# Patient Record
Sex: Female | Born: 1965
Health system: Southern US, Community
[De-identification: ages and names within clinical notes are randomized; demographics above are authoritative.]

## PROBLEM LIST (undated history)

## (undated) DIAGNOSIS — Z87898 Personal history of other specified conditions: Secondary | ICD-10-CM

## (undated) DIAGNOSIS — T7840XA Allergy, unspecified, initial encounter: Secondary | ICD-10-CM

## (undated) DIAGNOSIS — G510 Bell's palsy: Secondary | ICD-10-CM

## (undated) DIAGNOSIS — D219 Benign neoplasm of connective and other soft tissue, unspecified: Secondary | ICD-10-CM

## (undated) HISTORY — DX: Bell's palsy: G51.0

## (undated) HISTORY — DX: Personal history of other specified conditions: Z87.898

## (undated) HISTORY — PX: MYOMECTOMY: SHX85

## (undated) HISTORY — PX: HERNIA REPAIR: SHX51

## (undated) HISTORY — PX: WISDOM TOOTH EXTRACTION: SHX21

## (undated) HISTORY — DX: Benign neoplasm of connective and other soft tissue, unspecified: D21.9

## (undated) HISTORY — PX: KNEE SURGERY: SHX244

## (undated) HISTORY — DX: Allergy, unspecified, initial encounter: T78.40XA

---

## 1997-08-24 ENCOUNTER — Ambulatory Visit (HOSPITAL_COMMUNITY): Admission: RE | Admit: 1997-08-24 | Discharge: 1997-08-24 | Payer: Self-pay | Admitting: Family Medicine

## 1997-08-24 ENCOUNTER — Other Ambulatory Visit: Admission: RE | Admit: 1997-08-24 | Discharge: 1997-08-24 | Payer: Self-pay | Admitting: Family Medicine

## 1999-12-08 ENCOUNTER — Encounter: Admission: RE | Admit: 1999-12-08 | Discharge: 2000-01-26 | Payer: Self-pay | Admitting: Orthopedic Surgery

## 2000-03-01 ENCOUNTER — Encounter: Payer: Self-pay | Admitting: Family Medicine

## 2000-03-01 ENCOUNTER — Ambulatory Visit (HOSPITAL_COMMUNITY): Admission: RE | Admit: 2000-03-01 | Discharge: 2000-03-01 | Payer: Self-pay | Admitting: Family Medicine

## 2001-11-12 ENCOUNTER — Emergency Department (HOSPITAL_COMMUNITY): Admission: EM | Admit: 2001-11-12 | Discharge: 2001-11-12 | Payer: Self-pay | Admitting: Emergency Medicine

## 2001-11-12 ENCOUNTER — Encounter: Payer: Self-pay | Admitting: Emergency Medicine

## 2002-09-01 ENCOUNTER — Encounter: Payer: Self-pay | Admitting: Family Medicine

## 2002-09-01 ENCOUNTER — Ambulatory Visit (HOSPITAL_COMMUNITY): Admission: RE | Admit: 2002-09-01 | Discharge: 2002-09-01 | Payer: Self-pay | Admitting: Family Medicine

## 2004-03-31 ENCOUNTER — Ambulatory Visit: Payer: Self-pay | Admitting: Family Medicine

## 2004-03-31 ENCOUNTER — Ambulatory Visit: Payer: Self-pay | Admitting: Internal Medicine

## 2004-04-14 ENCOUNTER — Ambulatory Visit (HOSPITAL_COMMUNITY): Admission: RE | Admit: 2004-04-14 | Discharge: 2004-04-14 | Payer: Self-pay | Admitting: Family Medicine

## 2004-04-19 ENCOUNTER — Ambulatory Visit: Payer: Self-pay | Admitting: Family Medicine

## 2006-09-25 ENCOUNTER — Ambulatory Visit: Payer: Self-pay | Admitting: Family Medicine

## 2006-10-02 ENCOUNTER — Ambulatory Visit (HOSPITAL_COMMUNITY): Admission: RE | Admit: 2006-10-02 | Discharge: 2006-10-02 | Payer: Self-pay | Admitting: Internal Medicine

## 2006-10-09 ENCOUNTER — Encounter (INDEPENDENT_AMBULATORY_CARE_PROVIDER_SITE_OTHER): Payer: Self-pay | Admitting: Family Medicine

## 2006-10-09 ENCOUNTER — Ambulatory Visit: Payer: Self-pay | Admitting: Family Medicine

## 2006-11-25 ENCOUNTER — Ambulatory Visit: Payer: Self-pay | Admitting: Family Medicine

## 2006-11-25 ENCOUNTER — Telehealth (INDEPENDENT_AMBULATORY_CARE_PROVIDER_SITE_OTHER): Payer: Self-pay | Admitting: Family Medicine

## 2006-11-25 ENCOUNTER — Encounter: Payer: Self-pay | Admitting: Family Medicine

## 2006-11-25 ENCOUNTER — Ambulatory Visit: Payer: Self-pay | Admitting: *Deleted

## 2006-11-27 ENCOUNTER — Telehealth (INDEPENDENT_AMBULATORY_CARE_PROVIDER_SITE_OTHER): Payer: Self-pay | Admitting: *Deleted

## 2006-11-29 DIAGNOSIS — D259 Leiomyoma of uterus, unspecified: Secondary | ICD-10-CM | POA: Insufficient documentation

## 2006-12-18 ENCOUNTER — Encounter (INDEPENDENT_AMBULATORY_CARE_PROVIDER_SITE_OTHER): Payer: Self-pay | Admitting: *Deleted

## 2007-11-19 ENCOUNTER — Ambulatory Visit (HOSPITAL_COMMUNITY): Admission: RE | Admit: 2007-11-19 | Discharge: 2007-11-19 | Payer: Self-pay | Admitting: Family Medicine

## 2007-12-03 ENCOUNTER — Ambulatory Visit (HOSPITAL_COMMUNITY): Admission: EM | Admit: 2007-12-03 | Discharge: 2007-12-04 | Payer: Self-pay | Admitting: Emergency Medicine

## 2007-12-03 ENCOUNTER — Encounter (INDEPENDENT_AMBULATORY_CARE_PROVIDER_SITE_OTHER): Payer: Self-pay | Admitting: General Surgery

## 2008-05-20 ENCOUNTER — Telehealth (INDEPENDENT_AMBULATORY_CARE_PROVIDER_SITE_OTHER): Payer: Self-pay | Admitting: *Deleted

## 2008-05-25 ENCOUNTER — Ambulatory Visit: Payer: Self-pay | Admitting: Internal Medicine

## 2008-05-25 DIAGNOSIS — M25569 Pain in unspecified knee: Secondary | ICD-10-CM

## 2008-06-08 ENCOUNTER — Ambulatory Visit: Payer: Self-pay | Admitting: Internal Medicine

## 2008-10-11 ENCOUNTER — Telehealth (INDEPENDENT_AMBULATORY_CARE_PROVIDER_SITE_OTHER): Payer: Self-pay | Admitting: Nurse Practitioner

## 2008-10-15 ENCOUNTER — Ambulatory Visit (HOSPITAL_COMMUNITY): Admission: RE | Admit: 2008-10-15 | Discharge: 2008-10-15 | Payer: Self-pay | Admitting: Physician Assistant

## 2008-10-15 ENCOUNTER — Telehealth: Payer: Self-pay | Admitting: Physician Assistant

## 2008-10-15 ENCOUNTER — Ambulatory Visit: Payer: Self-pay | Admitting: Physician Assistant

## 2008-10-15 DIAGNOSIS — S6710XA Crushing injury of unspecified finger(s), initial encounter: Secondary | ICD-10-CM | POA: Insufficient documentation

## 2010-08-15 NOTE — H&P (Signed)
NAMEKENIA, Laurie Hull              ACCOUNT NO.:  [id number]   MEDICAL RECORD NO.:  [id number]          PATIENT TYPE:  INP   LOCATION:  5125                         FACILITY:  [facility]   PHYSICIAN:  Gabrielle Dare. Janee Morn, M.D.DATE OF BIRTH:  1965/07/25   DATE OF ADMISSION:  12/02/2007  DATE OF DISCHARGE:                              HISTORY & PHYSICAL   CHIEF COMPLAINT:  Umbilical pain.   HISTORY OF PRESENT ILLNESS:  Laurie Hull is a 45 year old African  American female who developed periumbilical pain and a small lump  starting Sunday on November 30, 2007, after sexual intercourse.  The area  was okay yesterday; however, she had increasing pain in the umbilical  region with an increased size in the lump throughout the day today.  She  had decreased appetite.  She came to the emergency department for  further evaluation.  She was seen by the emergency department physician  and physician assistant.  The CT scan of the abdomen and pelvis was  obtained, demonstrating an umbilical hernia containing omentum.  No  bowel is involved in the hernia.  However, there is some question of  necrosis of the omentum.  She continues to have some significant pain in  the area.  She has had no nausea or vomiting and has no other  complaints.  She has had one bowel movement since Sunday.   PAST MEDICAL HISTORY:  Fibroid uterus.   PAST SURGICAL HISTORY:  None.   SOCIAL HISTORY:  She smokes cigarettes.  She smokes marijuana.  She  drinks alcohol in weekends, and she does occasionally use cocaine.  She  was laid off from [employer] at [place], but hopes to return.   ALLERGIES:  No known drug allergies.   MEDICATIONS:  None.   PAST SURGICAL HISTORY:  None.   REVIEW OF SYSTEMS:  ABDOMEN:  Abdominal complaints as described above.  PULMONARY:  Negative.  GU:  Negative with no current fibroid pain.  CARDIOVASCULAR:  Negative.  CONSTITUTIONAL:  Negative.  NEUROPSYCH:  Remainder of the review of systems is  negative.   PHYSICAL EXAMINATION:  VITALS:  Temperature 97.3, pulse 59, respirations  18, blood pressure 124/75.  GENERAL:  She is awake and alert.  She appears somewhat elder than  stated age.  She is in no distress.  HEENT:  Pupils were equal round and reactive.  Sclerae is clear.  Oral  mucosa is moist.  NECK:  Supple with no tenderness.  LYMPH:  Reveals no supraclavicular, cervical, or inguinal  lymphadenopathy.  The periumbilical region is difficult to assess  secondary to her hernia.  PULMONARY:  Lungs are clear to auscultation with no wheezing.  Respiratory effort is good.  CARDIAC:  Regular.  No murmurs are heard.  Impulse is palpable in the  left chest.  ABDOMEN:  Soft.  There is no generalized tenderness.  Bowel sounds are  hypoactive.  She has 3 cm umbilical hernia just beneath her umbilicus.  It is incarcerated and unable to be reduced.  It is very painful.  There  is also some minimal overlying erythema of her skin.  Enlarged uterus is  palpable in the lower midline.  EXTREMITIES:  With no deformities or tenderness.  SKIN:  Warm and dry with exceptional findings.  NEUROLOGIC:  She is alert and oriented, and moves all extremities well  without focal deficits noted.   LABORATORY DATA:  Laboratory studies are pending.  CT scan is as  described above demonstrating the umbilical hernia; however, it also  shows a 16 cm fibroid uterus.   IMPRESSION:  Incarcerated umbilical hernia.   PLAN:  We will take her to the operating room emergently tonight for  repair possibly with mesh.  Procedure, risks, and benefits were  discussed in detail with the patient and her father.  Questions were  answered.  She is agreeable.  We will give her IV antibiotics and call  the [facility] institution.      Gabrielle Dare Janee Morn, M.D.  Electronically Signed     BET/MEDQ  D:  12/02/2007  T:  12/03/2007  Job:  [id number]

## 2010-08-15 NOTE — Op Note (Signed)
Laurie Hull, Laurie Hull              ACCOUNT NO.:  [id number]   MEDICAL RECORD NO.:  [id number]          PATIENT TYPE:  OIB   LOCATION:  5125                         FACILITY:  [facility]   PHYSICIAN:  Gabrielle Dare. Janee Morn, M.D.DATE OF BIRTH:  [birth year]   DATE OF PROCEDURE:  12/03/2007  DATE OF DISCHARGE:                               OPERATIVE REPORT   PREOPERATIVE DIAGNOSIS:  Incarcerated umbilical hernia.   POSTOPERATIVE DIAGNOSIS:  Incarcerated umbilical hernia.   PROCEDURE:  1. Repair of incarcerated umbilical hernia with mesh.  2. Partial omentectomy.   SURGEON:  Gabrielle Dare. Janee Morn, M.D.   ANESTHESIA:  General.   HISTORY OF PRESENT ILLNESS:  [name] is a 45 year old African  American female who developed sudden onset of periumbilical pain and a  small lump on November 30, 2007.  The pain increased significantly and the  lump enlarged.  She was evaluated in the emergency department.  Physical  exam and CT scan of the abdomen and pelvis were consistent with  incarcerated umbilical hernia containing omentum with possible omental  necrosis.  She was evaluated and brought emergently to the operating  room for repair of this incarcerated umbilical hernia.   PROCEDURE IN DETAIL:  An informed consent was obtained.  The patient was  identified in the preop holding area.  She received intravenous  antibiotics.  She was brought to the operating room and general  endotracheal anesthesia was administered by the anesthesia staff.  Her  abdomen was prepped and draped in the sterile fashion.  A infraumbilical  region was infiltrated with 0.25% Marcaine with epinephrine.  A  curvilinear infraumbilical incision was made.  Subcutaneous tissues were  dissected down revealing the hernia sac containing the omentum.  This  was circumferentially dissected and the umbilical skin was carefully  dissected off the hernia sac, keeping it intact.  Sac was then entered,  it contained omentum with some patchy  areas of contusions; however, no  frank necrosis was seen.  The hernia sac was excised.  The omentum was  about 4-5 cm in size, which was protruding through 1.5 cm defect.  This  was circumferentially dissected, and the omentum was divided with Kelly  clamps and tied securely with 2-0 Vicryl suture, achieving excellent  hemostasis.  The omentum and hernia sac were sent to pathology.  Hemostasis was ensured.  The fascia was circumferentially cleared away  in preparation for the hernia repair.  The hernia repair was then  accomplished with a polypropylene mesh was cut to size to provide at  least 1.5 circumferential inlay beyond the defect.  This was then  sutured in an inlay fashion with 0-Prolene sutures circumferentially.  The mesh laid nicely behind the fascia.  Some additional 0 Prolene  sutures were then used to close the fascia over the mesh.  The area was  copiously irrigated and hemostasis was ensured.  The umbilicus was  intact, taken down to the underlying fascia was interrupted with 2-0  Vicryl sutures.  Subcutaneous tissue was closed with running primarily  with interrupted 3-0 Vicryl sutures and the skin was closing with  running  4-0 Monocryl subcuticular stitch.  The  sponge, needle, and instrument counts were all correct.  Benzoin, Steri-  Strips, a sterile cotton-ball and sterile dressing were applied.  The  patient tolerated the procedure well without apparent complications, was  taken to the recovery room in stable condition.      Gabrielle Dare Janee Morn, M.D.  Electronically Signed     BET/MEDQ  D:  12/03/2007  T:  12/03/2007  Job:  [id number]

## 2010-09-01 ENCOUNTER — Other Ambulatory Visit: Payer: Self-pay | Admitting: Obstetrics and Gynecology

## 2010-09-01 ENCOUNTER — Encounter (HOSPITAL_COMMUNITY): Payer: Managed Care, Other (non HMO)

## 2010-09-01 ENCOUNTER — Other Ambulatory Visit (HOSPITAL_COMMUNITY): Payer: Self-pay

## 2010-09-01 LAB — CBC
HCT: 35.2 % — ABNORMAL LOW (ref 36.0–46.0)
Hemoglobin: 11.6 g/dL — ABNORMAL LOW (ref 12.0–15.0)
MCH: 30.3 pg (ref 26.0–34.0)
MCV: 91.9 fL (ref 78.0–100.0)
Platelets: 254 10*3/uL (ref 150–400)
RBC: 3.83 MIL/uL — ABNORMAL LOW (ref 3.87–5.11)
WBC: 4.3 10*3/uL (ref 4.0–10.5)

## 2010-09-05 ENCOUNTER — Inpatient Hospital Stay (HOSPITAL_COMMUNITY)
Admission: RE | Admit: 2010-09-05 | Discharge: 2010-09-08 | DRG: 743 | Disposition: A | Discharge status: Home / Self Care | Payer: Managed Care, Other (non HMO) | Source: Ambulatory Visit | Attending: Obstetrics and Gynecology | Admitting: Obstetrics and Gynecology

## 2010-09-05 ENCOUNTER — Other Ambulatory Visit: Payer: Self-pay | Admitting: Obstetrics and Gynecology

## 2010-09-05 DIAGNOSIS — N979 Female infertility, unspecified: Secondary | ICD-10-CM | POA: Diagnosis present

## 2010-09-05 DIAGNOSIS — Z01818 Encounter for other preprocedural examination: Secondary | ICD-10-CM

## 2010-09-05 DIAGNOSIS — Z01812 Encounter for preprocedural laboratory examination: Secondary | ICD-10-CM

## 2010-09-05 DIAGNOSIS — N920 Excessive and frequent menstruation with regular cycle: Secondary | ICD-10-CM | POA: Diagnosis present

## 2010-09-05 DIAGNOSIS — D251 Intramural leiomyoma of uterus: Principal | ICD-10-CM | POA: Diagnosis present

## 2010-09-05 LAB — CBC
HCT: 24.9 % — ABNORMAL LOW (ref 36.0–46.0)
HCT: 23.4 % — ABNORMAL LOW (ref 36.0–46.0)
Hemoglobin: 7.7 g/dL — ABNORMAL LOW (ref 12.0–15.0)
Platelets: 256 10*3/uL (ref 150–400)
RBC: 2.7 MIL/uL — ABNORMAL LOW (ref 3.87–5.11)
RBC: 2.52 MIL/uL — ABNORMAL LOW (ref 3.87–5.11)
RDW: 13 % (ref 11.5–15.5)
WBC: 16.7 10*3/uL — ABNORMAL HIGH (ref 4.0–10.5)

## 2010-09-05 LAB — ABO/RH: ABO/RH(D): B POS

## 2010-09-05 LAB — PREGNANCY, URINE: Preg Test, Ur: NEGATIVE

## 2010-09-06 LAB — CBC
HCT: 17.1 % — ABNORMAL LOW (ref 36.0–46.0)
HCT: 19.1 % — ABNORMAL LOW (ref 36.0–46.0)
HCT: 20.4 % — ABNORMAL LOW (ref 36.0–46.0)
Hemoglobin: 5.7 g/dL — CL (ref 12.0–15.0)
MCH: 30.3 pg (ref 26.0–34.0)
MCH: 30.4 pg (ref 26.0–34.0)
MCH: 30.5 pg (ref 26.0–34.0)
MCHC: 33.3 g/dL (ref 30.0–36.0)
MCHC: 34.3 g/dL (ref 30.0–36.0)
MCV: 88.7 fL (ref 78.0–100.0)
MCV: 90.5 fL (ref 78.0–100.0)
MCV: 91.4 fL (ref 78.0–100.0)
RBC: 2.11 MIL/uL — ABNORMAL LOW (ref 3.87–5.11)
RDW: 13.1 % (ref 11.5–15.5)
RDW: 14.2 % (ref 11.5–15.5)
WBC: 9.8 10*3/uL (ref 4.0–10.5)

## 2010-09-07 LAB — TYPE AND SCREEN
ABO/RH(D): B POS
Antibody Screen: NEGATIVE
Unit division: 0

## 2010-09-07 LAB — CBC
HCT: 23.4 % — ABNORMAL LOW (ref 36.0–46.0)
MCH: 29.9 pg (ref 26.0–34.0)
MCHC: 34.1 g/dL (ref 30.0–36.0)
MCHC: 33.8 g/dL (ref 30.0–36.0)
MCV: 87.6 fL (ref 78.0–100.0)
Platelets: 146 10*3/uL — ABNORMAL LOW (ref 150–400)
Platelets: 157 10*3/uL (ref 150–400)
RBC: 2.41 MIL/uL — ABNORMAL LOW (ref 3.87–5.11)
RDW: 14.7 % (ref 11.5–15.5)
RDW: 14.8 % (ref 11.5–15.5)

## 2010-09-07 LAB — PROTIME-INR: Prothrombin Time: 14.2 seconds (ref 11.6–15.2)

## 2010-09-11 ENCOUNTER — Inpatient Hospital Stay (HOSPITAL_COMMUNITY)
Admission: AD | Admit: 2010-09-11 | Discharge: 2010-09-11 | Disposition: A | Discharge status: Home / Self Care | Payer: Managed Care, Other (non HMO) | Source: Ambulatory Visit | Attending: Obstetrics and Gynecology | Admitting: Obstetrics and Gynecology

## 2010-09-11 DIAGNOSIS — R509 Fever, unspecified: Secondary | ICD-10-CM | POA: Insufficient documentation

## 2010-09-11 LAB — CBC
HCT: 24.6 % — ABNORMAL LOW (ref 36.0–46.0)
Hemoglobin: 8.3 g/dL — ABNORMAL LOW (ref 12.0–15.0)
MCH: 30.1 pg (ref 26.0–34.0)
MCHC: 33.7 g/dL (ref 30.0–36.0)

## 2010-10-25 NOTE — Op Note (Signed)
  NAMEVEVERLY, LARIMER              ACCOUNT NO.:  [id number]  MEDICAL RECORD NO.:  [id number]  LOCATION:  9309                          FACILITY:  [facility]  PHYSICIAN:  Zelphia Cairo, MD    DATE OF BIRTH:  [birth year]  DATE OF PROCEDURE:  09/05/2010 DATE OF DISCHARGE:                              OPERATIVE REPORT   PREOPERATIVE DIAGNOSES: 1. Uterine fibroids. 2. Infertility. 3. Menorrhagia.  PROCEDURE:  Myomectomy.  SURGEON:  Zelphia Cairo, MD  ASSISTANT:  Marcelle Overlie.  ANESTHESIA:  General.  FINDINGS:  Multiple uterine fibroids, approximately 36 removed and sent to Pathology.  URINE OUTPUT:  100 mL, clear.  ESTIMATED BLOOD LOSS:  700 mL.  COMPLICATIONS:  None.  CONDITION:  Stable to recovery room.  PROCEDURE:  After informed consent was obtained, the patient was taken to the operating room where she was prepped and draped in sterile fashion.  She was placed in the supine position and given general anesthesia.  She was prepped and draped in sterile fashion and a Foley catheter was in inserted sterilely.  Pfannenstiel skin incision was made with a scalpel and extended to the underlying fascia.  The fascia was incised in the midline.  This was extended laterally using curved Mayo scissors.  Kocher clamps were used to grasp the superior and inferior portion of the fascia.  The fascia was tented upwards and the underlying rectus muscles were dissected off using the Bovie.  Peritoneum was identified and tented upwards.  This was entered sharply with Metzenbaum scissors.  This was extended superiorly and inferiorly with good visualization of the bladder.  The uterus was then manually delivered through the uterine incision.  Multiple uterine fibroids were noted. She had a large pedunculated fundal fibroid along with multiple intramural fibroids.  An incision was made over the fibroid and a towel clamp was used to grasp the fibroid.  The myometrium was dissected off using the  Bovie and Metzenbaum scissors.  The uterine defect was closed using double layer closure of 0 Monocryl and 0 Vicryl pop.  This procedure was repeated multiple times until all uterine fibroids were removed.  The fallopian tubes and ovaries appeared normal.  Surgicel was placed over the uterine incisions and the uterus was placed back into the pelvis.  The uterus was reinspected and again found to be hemostatic.  The peritoneum was closed with 0 Monocryl.  The fascia was closed with a looped 0 PDS, and the skin was closed with staples. Sponge, lap, needle, and instrument counts were correct x2.  She was taken to the recovery room.  A CBC will be drawn in the recovery room as blood loss was 1700 mL.  During surgery, we called out to talk with the family and after surgery was completed, I walked out to talk with the family however, no one was in all 3 waiting rooms.     Zelphia Cairo, MD     GA/MEDQ  D:  09/05/2010  T:  09/05/2010  Job:  [id number]  Electronically Signed by Zelphia Cairo MD on 10/25/2010 05:12:39 PM

## 2011-01-03 LAB — POCT I-STAT, CHEM 8
BUN: 4 — ABNORMAL LOW
Calcium, Ion: 1.25
Chloride: 103
Creatinine, Ser: 1.1
Glucose, Bld: 86
TCO2: 27

## 2011-03-07 ENCOUNTER — Ambulatory Visit: Payer: Managed Care, Other (non HMO) | Admitting: Medical

## 2011-12-18 ENCOUNTER — Encounter (HOSPITAL_COMMUNITY): Payer: Self-pay | Admitting: Emergency Medicine

## 2011-12-18 ENCOUNTER — Emergency Department (HOSPITAL_COMMUNITY): Payer: Self-pay

## 2011-12-18 ENCOUNTER — Emergency Department (HOSPITAL_COMMUNITY)
Admission: EM | Admit: 2011-12-18 | Discharge: 2011-12-19 | Disposition: A | Discharge status: Home / Self Care | Payer: Self-pay | Attending: Emergency Medicine | Admitting: Emergency Medicine

## 2011-12-18 DIAGNOSIS — B9689 Other specified bacterial agents as the cause of diseases classified elsewhere: Secondary | ICD-10-CM | POA: Insufficient documentation

## 2011-12-18 DIAGNOSIS — Z888 Allergy status to other drugs, medicaments and biological substances status: Secondary | ICD-10-CM | POA: Insufficient documentation

## 2011-12-18 DIAGNOSIS — A499 Bacterial infection, unspecified: Secondary | ICD-10-CM | POA: Insufficient documentation

## 2011-12-18 DIAGNOSIS — N760 Acute vaginitis: Secondary | ICD-10-CM | POA: Insufficient documentation

## 2011-12-18 DIAGNOSIS — R109 Unspecified abdominal pain: Secondary | ICD-10-CM | POA: Insufficient documentation

## 2011-12-18 DIAGNOSIS — E669 Obesity, unspecified: Secondary | ICD-10-CM | POA: Insufficient documentation

## 2011-12-18 LAB — COMPREHENSIVE METABOLIC PANEL
ALT: 12 U/L (ref 0–35)
AST: 13 U/L (ref 0–37)
CO2: 27 mEq/L (ref 19–32)
Calcium: 9.7 mg/dL (ref 8.4–10.5)
Creatinine, Ser: 0.9 mg/dL (ref 0.50–1.10)
GFR calc Af Amer: 88 mL/min — ABNORMAL LOW (ref >90)
GFR calc non Af Amer: 76 mL/min — ABNORMAL LOW (ref >90)
Glucose, Bld: 90 mg/dL (ref 70–99)
Sodium: 142 mEq/L (ref 135–145)
Total Protein: 7.8 g/dL (ref 6.0–8.3)

## 2011-12-18 LAB — CBC WITH DIFFERENTIAL/PLATELET
Basophils Absolute: 0 10*3/uL (ref 0.0–0.1)
Eosinophils Absolute: 0.1 10*3/uL (ref 0.0–0.7)
Eosinophils Relative: 2 % (ref 0–5)
HCT: 36.6 % (ref 36.0–46.0)
Lymphocytes Relative: 47 % — ABNORMAL HIGH (ref 12–46)
MCH: 31 pg (ref 26.0–34.0)
MCV: 89.3 fL (ref 78.0–100.0)
Monocytes Absolute: 0.4 10*3/uL (ref 0.1–1.0)
Platelets: 315 10*3/uL (ref 150–400)
RDW: 12.9 % (ref 11.5–15.5)

## 2011-12-18 LAB — URINALYSIS, ROUTINE W REFLEX MICROSCOPIC
Hgb urine dipstick: NEGATIVE
Leukocytes, UA: NEGATIVE
Protein, ur: NEGATIVE mg/dL
Specific Gravity, Urine: 1.025 (ref 1.005–1.030)
Urobilinogen, UA: 0.2 mg/dL (ref 0.0–1.0)

## 2011-12-18 LAB — POCT PREGNANCY, URINE: Preg Test, Ur: NEGATIVE

## 2011-12-18 MED ORDER — PROMETHAZINE HCL 25 MG PO TABS: 25.0000 mg | ORAL_TABLET | Freq: Four times a day (QID) | ORAL | Status: DC | PRN

## 2011-12-18 MED ORDER — HYDROMORPHONE HCL PF 1 MG/ML IJ SOLN
1.0000 mg | Freq: Once | INTRAMUSCULAR | Status: AC
  Administered 2011-12-18: 1 mg via INTRAMUSCULAR
  Filled 2011-12-18: qty 1

## 2011-12-18 MED ORDER — METRONIDAZOLE 500 MG PO TABS: 500.0000 mg | ORAL_TABLET | Freq: Two times a day (BID) | ORAL | Status: DC

## 2011-12-18 MED ORDER — HYDROCODONE-ACETAMINOPHEN 5-325 MG PO TABS: 1.0000 | ORAL_TABLET | ORAL | Status: DC | PRN

## 2011-12-18 NOTE — ED Notes (Signed)
Pt requesting pain med.  Now going to [place]

## 2011-12-18 NOTE — ED Provider Notes (Signed)
History     CSN: [id number]  Arrival date & time 12/18/11  1453   First MD Initiated Contact with Patient 12/18/11 2039      Chief Complaint  Patient presents with  . Flank Pain    (Consider location/radiation/quality/duration/timing/severity/associated sxs/prior treatment) HPI History provided by pt.   Pt presents w/ c/o right side pain x 4 months.  She can feel a knot in her side that is always present, but pain is intermittent.  Pain seems to be aggravated by a lot of movement and to improve slightly when she lays flat.  It may also be associated w/ eating greasy or acidic foods.   Seems to be worst during her menstrual cycle.  Developed intermittent episodes of vomiting 2 weeks ago.  Denies fever, CP, cough, diarrhea,  hematemesis/hematochezia/melena and GU sx.  Has experienced SOB while cleaning her house.  Past abd surgeries include myomectomy.    History reviewed. No pertinent past medical history.  Past Surgical History  Procedure Date  . Myomectomy     History reviewed. No pertinent family history.  History  Substance Use Topics  . Smoking status: Never Smoker   . Smokeless tobacco: Not on file  . Alcohol Use: Yes     occ    OB History    Grav Para Term Preterm Abortions TAB SAB Ect Mult Living                  Review of Systems  All other systems reviewed and are negative.    Allergies  Oxycodone-acetaminophen  Home Medications   Current Outpatient Rx  Name Route Sig Dispense Refill  . IBUPROFEN 200 MG PO TABS Oral Take 600 mg by mouth every 6 (six) hours as needed. For pain      BP 128/95  Pulse 74  Temp 98.8 F (37.1 C) (Oral)  Resp 18  SpO2 100%  LMP 11/20/2011  Physical Exam  Nursing note and vitals reviewed. Constitutional: She is oriented to person, place, and time. She appears well-developed and well-nourished.       Uncomfortable appearing  HENT:  Head: Normocephalic and atraumatic.  Eyes:       Normal appearance  Neck: Normal  range of motion.  Cardiovascular: Normal rate and regular rhythm.   Pulmonary/Chest: Effort normal and breath sounds normal. No respiratory distress.  Abdominal: Soft. Bowel sounds are normal. She exhibits no distension. There is no tenderness.       Obese.  There is a small palpable knot at right side but I suspect that it is adipose tissue.  Tenderness RUQ and right mid-abdomen.    Genitourinary:       No CVA tenderness.  Nml external genitalia.  No vaginal discharge/bleeding.  Cervix closed and appears nml.  No adnexal or cervical motion tenderess.    Musculoskeletal: Normal range of motion.  Neurological: She is alert and oriented to person, place, and time.  Skin: Skin is warm and dry. No rash noted.  Psychiatric: She has a normal mood and affect. Her behavior is normal.    ED Course  Procedures (including critical care time)  Labs Reviewed  CBC WITH DIFFERENTIAL - Abnormal; Notable for the following:    Lymphocytes Relative 47 (*)     All other components within normal limits  COMPREHENSIVE METABOLIC PANEL - Abnormal; Notable for the following:    Total Bilirubin 0.2 (*)     GFR calc non Af Amer 76 (*)     GFR  calc Af Amer 88 (*)     All other components within normal limits  LIPASE, BLOOD - Abnormal; Notable for the following:    Lipase 79 (*)     All other components within normal limits  URINALYSIS, ROUTINE W REFLEX MICROSCOPIC  POCT PREGNANCY, URINE   Dg Chest 2 View  12/18/2011  *RADIOLOGY REPORT*  Clinical Data: Right flank pain.  Shortness of breath.  CHEST - 2 VIEW  Comparison: None available.  Findings: The heart size is normal.  The lungs are clear.  The visualized soft tissues and bony thorax are unremarkable.  IMPRESSION: Negative chest.   Original Report Authenticated By: Jamesetta Orleans. MATTERN, M.D.    US Abdomen Complete  12/18/2011  *RADIOLOGY REPORT*  Clinical Data:  Right upper quadrant pain.  Vomiting.  COMPLETE ABDOMINAL ULTRASOUND  Comparison:  CT abdomen  and pelvis 12/02/2007.  Findings:  Gallbladder:  No gallstones, gallbladder wall thickening, or pericholecystic fluid.  Common bile duct:  Measures 0.5 cm.  Liver:  No focal lesion identified.  Within normal limits in parenchymal echogenicity.  IVC:  Appears normal.  Pancreas:  No focal abnormality seen.  Spleen:  Measures 9.1 cm and appears normal.  Right Kidney:  Measures 11.6 cm and appears normal.  Left Kidney:  Measures 11.6 cm and appears normal.  Abdominal aorta:  No aneurysm identified.  IMPRESSION: Negative abdominal ultrasound.   Original Report Authenticated By: Bernadene Bell. D'ALESSIO, M.D.      1. Abdominal pain   2. Bacterial vaginosis       MDM  Obese 46yo F presents w/ intermittent right-sided abd pain and N/V that may be associated w/ movement as well as eating greasy/acidic foods x 4 months.  Based on history and exam, I suspect cholelithiasis.  Pt declines pain medication at this time because she is driving.  Labs and [place] abd pending. 9:14 PM   Labs unremarkable w/ exception of mildly elevated lipase and mod clue cells, and [place] neg for cholelithiasis or other acute pathology.  Results discussed w/ pt.  Will d/c home w/ vicodin, phenergan, flagyl and referral to GI and Gyn (possible endometriosis) for further eval.  Low suspicion for pancreatitis but recommended clear fluids x 24 hours and gradual advancement of diet.  Pt is in agreement w/ plan.  Return precautions discussed.  10:57 PM         Otilio Miu, PA 12/18/11 [phone]

## 2011-12-18 NOTE — ED Notes (Signed)
The pt reports that her pain is not much better and now she is nauseated

## 2011-12-18 NOTE — ED Notes (Signed)
The pt is c/o rt lat abd pain for months.  The pain is worse for the past 2 months

## 2011-12-18 NOTE — ED Notes (Signed)
Pt c/o right flank pain x 4 months that is worse over last month; pt denies obvious injury but sts feels a "knot" in that area

## 2011-12-19 LAB — GC/CHLAMYDIA PROBE AMP, GENITAL: GC Probe Amp, Genital: NEGATIVE

## 2011-12-19 MED ORDER — PROMETHAZINE HCL 25 MG PO TABS: 25.0000 mg | ORAL_TABLET | Freq: Four times a day (QID) | ORAL | Status: DC | PRN

## 2011-12-19 MED ORDER — HYDROCODONE-ACETAMINOPHEN 5-325 MG PO TABS: 1.0000 | ORAL_TABLET | ORAL | Status: DC | PRN

## 2011-12-19 MED ORDER — METRONIDAZOLE 500 MG PO TABS: 500.0000 mg | ORAL_TABLET | Freq: Two times a day (BID) | ORAL | Status: DC

## 2011-12-19 NOTE — ED Provider Notes (Signed)
Medical screening examination/treatment/procedure(s) were performed by non-physician practitioner and as supervising physician I was immediately available for consultation/collaboration.   Audley Hinojos T Valli Randol, MD 12/19/11 0752 

## 2014-08-24 ENCOUNTER — Ambulatory Visit: Payer: Managed Care, Other (non HMO) | Attending: Internal Medicine

## 2014-08-25 ENCOUNTER — Ambulatory Visit: Payer: Managed Care, Other (non HMO) | Attending: Internal Medicine

## 2014-09-22 ENCOUNTER — Encounter: Payer: Self-pay | Admitting: Internal Medicine

## 2014-09-22 ENCOUNTER — Other Ambulatory Visit: Payer: Self-pay

## 2014-09-22 ENCOUNTER — Ambulatory Visit: Payer: Managed Care, Other (non HMO) | Attending: Internal Medicine | Admitting: Internal Medicine

## 2014-09-22 VITALS — BP 138/88 | HR 76 | Wt 245.2 lb

## 2014-09-22 DIAGNOSIS — D259 Leiomyoma of uterus, unspecified: Secondary | ICD-10-CM | POA: Diagnosis not present

## 2014-09-22 DIAGNOSIS — R5383 Other fatigue: Secondary | ICD-10-CM

## 2014-09-22 LAB — CBC WITH DIFFERENTIAL/PLATELET
BASOS PCT: 1 % (ref 0–1)
Basophils Absolute: 0 10*3/uL (ref 0.0–0.1)
Eosinophils Absolute: 0.1 10*3/uL (ref 0.0–0.7)
Eosinophils Relative: 2 % (ref 0–5)
HCT: 36.3 % (ref 36.0–46.0)
HEMOGLOBIN: 11.9 g/dL — AB (ref 12.0–15.0)
LYMPHS ABS: 1.4 10*3/uL (ref 0.7–4.0)
Lymphocytes Relative: 35 % (ref 12–46)
MCH: 30.4 pg (ref 26.0–34.0)
MCHC: 32.8 g/dL (ref 30.0–36.0)
MCV: 92.8 fL (ref 78.0–100.0)
MONOS PCT: 9 % (ref 3–12)
MPV: 9.7 fL (ref 8.6–12.4)
Monocytes Absolute: 0.4 10*3/uL (ref 0.1–1.0)
Neutro Abs: 2.1 10*3/uL (ref 1.7–7.7)
Neutrophils Relative %: 53 % (ref 43–77)
Platelets: 324 10*3/uL (ref 150–400)
RBC: 3.91 MIL/uL (ref 3.87–5.11)
RDW: 13.4 % (ref 11.5–15.5)
WBC: 3.9 10*3/uL — ABNORMAL LOW (ref 4.0–10.5)

## 2014-09-22 LAB — COMPLETE METABOLIC PANEL WITH GFR
ALBUMIN: 3.8 g/dL (ref 3.5–5.2)
ALT: 10 U/L (ref 0–35)
AST: 12 U/L (ref 0–37)
Alkaline Phosphatase: 59 U/L (ref 39–117)
BUN: 9 mg/dL (ref 6–23)
CALCIUM: 8.9 mg/dL (ref 8.4–10.5)
CO2: 26 meq/L (ref 19–32)
Chloride: 106 mEq/L (ref 96–112)
Creat: 0.85 mg/dL (ref 0.50–1.10)
GFR, EST NON AFRICAN AMERICAN: 81 mL/min
GFR, Est African American: >89 mL/min
GLUCOSE: 86 mg/dL (ref 70–99)
POTASSIUM: 3.8 meq/L (ref 3.5–5.3)
SODIUM: 139 meq/L (ref 135–145)
TOTAL PROTEIN: 6.3 g/dL (ref 6.0–8.3)
Total Bilirubin: 0.4 mg/dL (ref 0.2–1.2)

## 2014-09-22 LAB — TSH: TSH: 0.753 u[IU]/mL (ref 0.350–4.500)

## 2014-09-22 MED ORDER — IBUPROFEN 800 MG PO TABS: 800.0000 mg | ORAL_TABLET | Freq: Three times a day (TID) | ORAL | Status: DC | PRN

## 2014-09-22 NOTE — Progress Notes (Signed)
Patient ID: Laurie Hull, female   DOB: 06-25-1965, 49 y.o.   MRN: [id number]  ELF:[id number]  HEN:[id number]  DOB - 04-21-1965  CC:  Chief Complaint  Patient presents with  . New patient    Bump on breast   . Fibroids       HPI: Laurie Hull is a 49 y.o. female here today to establish medical care. Patient has a past medical history of uterine fibroids with a myomectomy 4 years ago.   She states that she has been unable to go back to GYN because she owes money from her surgery. Has been bleeding for several weeks off and on often very heavy. She has abdominal pain daily that radiates to her back. Has been taking ibuprofen for pain. Feels tired daily.   Patient has No headache, No chest pain, No Nausea, No new weakness tingling or numbness, No Cough - SOB.  Allergies  Allergen Reactions  . Oxycodone-Acetaminophen Itching and Rash   Past Medical History  Diagnosis Date  . Fibroids    No current outpatient prescriptions on file prior to visit.   No current facility-administered medications on file prior to visit.   Family History  Problem Relation Age of Onset  . Diabetes Mother   . Hypertension Mother   . Heart disease Father   . Hypertension Father    History   Social History  . Marital Status: Single    Spouse Name: N/A  . Number of Children: N/A  . Years of Education: N/A   Occupational History  . Not on file.   Social History Main Topics  . Smoking status: Current Every Day Smoker  . Smokeless tobacco: Not on file     Comment: smoke weed   . Alcohol Use: 0.0 oz/week    0 Standard drinks or equivalent per week     Comment: occ  . Drug Use: Yes    Special: Marijuana  . Sexual Activity: Not on file   Other Topics Concern  . Not on file   Social History Narrative    Review of Systems: See HPI.    Objective:   Filed Vitals:   09/22/14 0913  BP: 138/88  Pulse: 76    Physical Exam  Cardiovascular: Normal rate, regular rhythm and normal heart  sounds.   Pulmonary/Chest: Effort normal and breath sounds normal.  Abdominal: Soft. Bowel sounds are normal. She exhibits no distension. There is tenderness (pelvic).  Musculoskeletal: Normal range of motion.     Lab Results  Component Value Date   WBC 5.8 12/18/2011   HGB 12.7 12/18/2011   HCT 36.6 12/18/2011   MCV 89.3 12/18/2011   PLT 315 12/18/2011   Lab Results  Component Value Date   CREATININE 0.90 12/18/2011   BUN 9 12/18/2011   NA 142 12/18/2011   K 3.8 12/18/2011   CL 105 12/18/2011   CO2 27 12/18/2011    No results found for: HGBA1C Lipid Panel  No results found for: CHOL, TRIG, HDL, CHOLHDL, VLDL, LDLCALC     Assessment and plan:   Laurie Hull was seen today for new patient and fibroids.  Diagnoses and all orders for this visit:  Uterine leiomyoma, unspecified location Orders: -    Begin ibuprofen (ADVIL,MOTRIN) 800 MG tablet; Take 1 tablet (800 mg total) by mouth every 8 (eight) hours as needed. -     Ambulatory referral to Gynecology  Other fatigue Orders: -     CBC with Differential -  COMPLETE METABOLIC PANEL WITH GFR -     TSH Patient is likely anemic and has not had a cbc since 2013.    Return if symptoms worsen or fail to improve.   Chari Manning, [facility] [phone] 09/22/2014, 9:29 AM

## 2014-09-22 NOTE — Patient Instructions (Addendum)
GYN will call you with a appointment when the referral has been authorized.

## 2014-09-22 NOTE — Progress Notes (Signed)
  New patient here to discuss fibroids. She also has a bump on chest she is concern about.

## 2014-09-24 ENCOUNTER — Encounter: Payer: Self-pay | Admitting: Obstetrics & Gynecology

## 2014-09-28 ENCOUNTER — Other Ambulatory Visit: Payer: Self-pay

## 2014-10-21 ENCOUNTER — Ambulatory Visit (INDEPENDENT_AMBULATORY_CARE_PROVIDER_SITE_OTHER): Payer: Self-pay | Admitting: Obstetrics & Gynecology

## 2014-10-21 ENCOUNTER — Telehealth: Payer: Self-pay | Admitting: General Practice

## 2014-10-21 ENCOUNTER — Encounter: Payer: Self-pay | Admitting: Obstetrics & Gynecology

## 2014-10-21 VITALS — BP 129/79 | HR 71 | Temp 98.4°F | Ht 68.0 in | Wt 250.5 lb

## 2014-10-21 DIAGNOSIS — Z1151 Encounter for screening for human papillomavirus (HPV): Secondary | ICD-10-CM

## 2014-10-21 DIAGNOSIS — Z124 Encounter for screening for malignant neoplasm of cervix: Secondary | ICD-10-CM

## 2014-10-21 DIAGNOSIS — D251 Intramural leiomyoma of uterus: Secondary | ICD-10-CM

## 2014-10-21 DIAGNOSIS — N920 Excessive and frequent menstruation with regular cycle: Secondary | ICD-10-CM

## 2014-10-21 DIAGNOSIS — E669 Obesity, unspecified: Secondary | ICD-10-CM | POA: Insufficient documentation

## 2014-10-21 NOTE — Progress Notes (Signed)
   Subjective:    Patient ID: Laurie Hull, female    DOB: [birth year], 49 y.o.   MRN: [id number]  HPI  This SAA lady is here because a recent onset of heavier than normal periods. She has a h/o fibroids and had a myomectomy at [facility].  Review of Systems No recent pap or mammogram She does not use contraception.    Objective:   Physical Exam  WNWHobese pleasant BF, NAD Breathing, ambulating, and conversing normally Abd- obese, bening Cervix normal and pap obtained 12 week size uterus, non-palpable adnexa      Assessment & Plan:  Probable fibroids- check labs and [place] She filled out the mammogram scholarship paperwork

## 2014-10-21 NOTE — Telephone Encounter (Signed)
Called patient as she left before getting blood drawn. Patient states she can come back Monday 7/25 @ 8am. Patient had no questions

## 2014-10-22 LAB — CYTOLOGY - PAP

## 2014-10-25 ENCOUNTER — Other Ambulatory Visit: Payer: Self-pay

## 2014-10-26 LAB — CBC
HEMATOCRIT: 37.3 % (ref 36.0–46.0)
Hemoglobin: 12.3 g/dL (ref 12.0–15.0)
MCH: 30.9 pg (ref 26.0–34.0)
MCHC: 33 g/dL (ref 30.0–36.0)
MCV: 93.7 fL (ref 78.0–100.0)
MPV: 9.5 fL (ref 8.6–12.4)
PLATELETS: 301 10*3/uL (ref 150–400)
RBC: 3.98 MIL/uL (ref 3.87–5.11)
RDW: 13.5 % (ref 11.5–15.5)
WBC: 4.9 10*3/uL (ref 4.0–10.5)

## 2014-10-26 LAB — TSH: TSH: 0.929 u[IU]/mL (ref 0.350–4.500)

## 2014-11-03 ENCOUNTER — Ambulatory Visit (HOSPITAL_COMMUNITY)
Admission: RE | Admit: 2014-11-03 | Discharge: 2014-11-03 | Disposition: A | Discharge status: Home / Self Care | Payer: No Typology Code available for payment source | Source: Ambulatory Visit | Attending: Obstetrics & Gynecology | Admitting: Obstetrics & Gynecology

## 2014-11-03 DIAGNOSIS — D251 Intramural leiomyoma of uterus: Secondary | ICD-10-CM | POA: Insufficient documentation

## 2014-11-03 DIAGNOSIS — N800 Endometriosis of uterus: Secondary | ICD-10-CM | POA: Insufficient documentation

## 2014-11-03 DIAGNOSIS — N852 Hypertrophy of uterus: Secondary | ICD-10-CM | POA: Insufficient documentation

## 2014-11-03 DIAGNOSIS — N920 Excessive and frequent menstruation with regular cycle: Secondary | ICD-10-CM | POA: Insufficient documentation

## 2014-11-10 ENCOUNTER — Other Ambulatory Visit: Payer: Self-pay | Admitting: Obstetrics & Gynecology

## 2014-11-10 DIAGNOSIS — Z1231 Encounter for screening mammogram for malignant neoplasm of breast: Secondary | ICD-10-CM

## 2014-11-23 ENCOUNTER — Telehealth: Payer: Self-pay | Admitting: General Practice

## 2014-11-23 ENCOUNTER — Ambulatory Visit (HOSPITAL_COMMUNITY)
Admission: RE | Admit: 2014-11-23 | Discharge: 2014-11-23 | Disposition: A | Discharge status: Home / Self Care | Payer: No Typology Code available for payment source | Source: Ambulatory Visit | Attending: Obstetrics & Gynecology | Admitting: Obstetrics & Gynecology

## 2014-11-23 DIAGNOSIS — Z1231 Encounter for screening mammogram for malignant neoplasm of breast: Secondary | ICD-10-CM

## 2014-11-23 NOTE — Telephone Encounter (Signed)
Patient called and left message stating she wanted her ultrasound results. Called patient and informed her of ultrasound results showing a slightly enlarged uterus due to multiple small fibroids. Discussed with patient that it appears she needs a follow up appt with [place] and one has not been scheduled. Told patient I will let the front office know and they will contact her with an appt. Patient verbalized understanding and had no questions

## 2014-12-09 ENCOUNTER — Encounter: Payer: Self-pay | Admitting: Obstetrics & Gynecology

## 2014-12-09 ENCOUNTER — Ambulatory Visit (INDEPENDENT_AMBULATORY_CARE_PROVIDER_SITE_OTHER): Payer: Self-pay | Admitting: Obstetrics & Gynecology

## 2014-12-09 VITALS — BP 127/80 | HR 84 | Temp 98.4°F | Wt 249.5 lb

## 2014-12-09 DIAGNOSIS — N800 Endometriosis of uterus: Secondary | ICD-10-CM

## 2014-12-09 DIAGNOSIS — N8003 Adenomyosis of the uterus: Secondary | ICD-10-CM

## 2014-12-09 DIAGNOSIS — N946 Dysmenorrhea, unspecified: Secondary | ICD-10-CM

## 2014-12-09 DIAGNOSIS — D259 Leiomyoma of uterus, unspecified: Secondary | ICD-10-CM

## 2014-12-09 DIAGNOSIS — N809 Endometriosis, unspecified: Principal | ICD-10-CM

## 2014-12-09 MED ORDER — NORGESTREL-ETHINYL ESTRADIOL 0.3-30 MG-MCG PO TABS: 1.0000 | ORAL_TABLET | Freq: Every day | ORAL | Status: DC

## 2014-12-09 MED ORDER — IBUPROFEN 800 MG PO TABS: 800.0000 mg | ORAL_TABLET | Freq: Three times a day (TID) | ORAL | Status: DC | PRN

## 2014-12-09 NOTE — Progress Notes (Signed)
   Subjective:    Patient ID: Laurie Hull, female    DOB: [birth year], 49 y.o.   MRN: [id number]  HPI  49 yo AA P1 (12 yo son) here to discuss u/s. Her main problem is painful periods. She had a myomectomy in the past. Her u/s showed a 12 cm uterus with adenomyosis.   Review of Systems     Objective:   Physical Exam WNWHBFNAD Breathing, conversing, and ambulating normally Abd- benign       Assessment & Plan:  Dysmenorrhea with adenomyosis- offered OCPs, Mirena Trial of Lo ovral

## 2015-01-04 ENCOUNTER — Telehealth: Payer: Self-pay | Admitting: General Practice

## 2015-01-04 DIAGNOSIS — N946 Dysmenorrhea, unspecified: Secondary | ICD-10-CM

## 2015-01-04 MED ORDER — NORETHINDRONE ACET-ETHINYL EST 1.5-30 MG-MCG PO TABS: 1.0000 | ORAL_TABLET | Freq: Every day | ORAL | Status: DC

## 2015-01-04 NOTE — Telephone Encounter (Signed)
Patient called and left message stating she is a patient of Dr Alease Medina. Patient states she was given a prescription for OCPs. [facility] told her the Rx is too expensive and needs an alternative. Patient states they have been trying to contact us and it's been over a month now. Patient wants to know if she can get a different prescription. Spoke with Yvonne Kendall who stated we could try a Rx for Junel instead. Called patient and informed her of Rx sent to pharmacy. Told patient I was unable to reach anyone there to ask about cost, but if the new Rx is still too expensive- call me back so we can work something out. Patient verbalized understanding and had no questions

## 2015-01-19 ENCOUNTER — Ambulatory Visit (INDEPENDENT_AMBULATORY_CARE_PROVIDER_SITE_OTHER): Payer: No Typology Code available for payment source | Admitting: Family Medicine

## 2015-01-19 ENCOUNTER — Telehealth: Payer: Self-pay | Admitting: *Deleted

## 2015-01-19 ENCOUNTER — Encounter: Payer: Self-pay | Admitting: Family Medicine

## 2015-01-19 VITALS — BP 126/72 | HR 93 | Temp 98.9°F | Ht 68.0 in | Wt 243.7 lb

## 2015-01-19 DIAGNOSIS — N760 Acute vaginitis: Secondary | ICD-10-CM

## 2015-01-19 DIAGNOSIS — B9689 Other specified bacterial agents as the cause of diseases classified elsewhere: Secondary | ICD-10-CM

## 2015-01-19 DIAGNOSIS — A499 Bacterial infection, unspecified: Secondary | ICD-10-CM

## 2015-01-19 DIAGNOSIS — Z113 Encounter for screening for infections with a predominantly sexual mode of transmission: Secondary | ICD-10-CM

## 2015-01-19 DIAGNOSIS — Z124 Encounter for screening for malignant neoplasm of cervix: Secondary | ICD-10-CM

## 2015-01-19 MED ORDER — METRONIDAZOLE 500 MG PO TABS: 500.0000 mg | ORAL_TABLET | Freq: Two times a day (BID) | ORAL | Status: DC

## 2015-01-19 NOTE — Progress Notes (Signed)
Partner has noticed vaginal odor for about 1 month during intercourse.  Patient hasn't noticed it.  No discharge.

## 2015-01-19 NOTE — Patient Instructions (Addendum)
Bacterial Vaginosis is NOT a sexually transmitted disease.  Start probiotics now- Culturelle (pills), yogurt (live cultures) Cotton underwear Sleeping without underwear  Bacterial Vaginosis Bacterial vaginosis is a vaginal infection that occurs when the normal balance of bacteria in the vagina is disrupted. It results from an overgrowth of certain bacteria. This is the most common vaginal infection in women of childbearing age. Treatment is important to prevent complications, especially in pregnant women, as it can cause a premature delivery. CAUSES  Bacterial vaginosis is caused by an increase in harmful bacteria that are normally present in smaller amounts in the vagina. Several different kinds of bacteria can cause bacterial vaginosis. However, the reason that the condition develops is not fully understood. RISK FACTORS Certain activities or behaviors can put you at an increased risk of developing bacterial vaginosis, including:  Having a new sex partner or multiple sex partners.  Douching.  Using an intrauterine device (IUD) for contraception. Women do not get bacterial vaginosis from toilet seats, bedding, swimming pools, or contact with objects around them. SIGNS AND SYMPTOMS  Some women with bacterial vaginosis have no signs or symptoms. Common symptoms include:  Grey vaginal discharge.  A fishlike odor with discharge, especially after sexual intercourse.  Itching or burning of the vagina and vulva.  Burning or pain with urination. DIAGNOSIS  Your health care provider will take a medical history and examine the vagina for signs of bacterial vaginosis. A sample of vaginal fluid may be taken. Your health care provider will look at this sample under a microscope to check for bacteria and abnormal cells. A vaginal pH test may also be done.  TREATMENT  Bacterial vaginosis may be treated with antibiotic medicines. These may be given in the form of a pill or a vaginal cream. A second  round of antibiotics may be prescribed if the condition comes back after treatment. Because bacterial vaginosis increases your risk for sexually transmitted diseases, getting treated can help reduce your risk for chlamydia, gonorrhea, HIV, and herpes. HOME CARE INSTRUCTIONS   Only take over-the-counter or prescription medicines as directed by your health care provider.  If antibiotic medicine was prescribed, take it as directed. Make sure you finish it even if you start to feel better.  Tell all sexual partners that you have a vaginal infection. They should see their health care provider and be treated if they have problems, such as a mild rash or itching.  During treatment, it is important that you follow these instructions:  Avoid sexual activity or use condoms correctly.  Do not douche.  Avoid alcohol as directed by your health care provider.  Avoid breastfeeding as directed by your health care provider. SEEK MEDICAL CARE IF:   Your symptoms are not improving after 3 days of treatment.  You have increased discharge or pain.  You have a fever. MAKE SURE YOU:   Understand these instructions.  Will watch your condition.  Will get help right away if you are not doing well or get worse. FOR MORE INFORMATION  Centers for Disease Control and Prevention, Division of STD Prevention: [employer] American Sexual Health Association (ASHA): www.ashastd.org    This information is not intended to replace advice given to you by your health care provider. Make sure you discuss any questions you have with your health care provider.   Document Released: 03/19/2005 Document Revised: 04/09/2014 Document Reviewed: 10/29/2012 Elsevier Interactive Patient Education [employer].

## 2015-01-19 NOTE — Telephone Encounter (Signed)
Received message left on nurse line on 01/19/15 at 0909.  Patient states she is having vaginal odor and would like to have a prescription phoned in.  Requests a return call.  Spoke with patient via phone.  Scheduled appt for today at 1245.  Patient states understanding.

## 2015-01-19 NOTE — Progress Notes (Signed)
   CLINIC ENCOUNTER NOTE  History:  49 y.o. No obstetric history on file. here today for vaginal odor. Her partner noticed it this week. She denies any new sexual partners. She denies any abnormal vaginal discharge, bleeding, pelvic pain or other concerns.   Past Medical History  Diagnosis Date  . Fibroids     Past Surgical History  Procedure Laterality Date  . Myomectomy      The following portions of the patient's history were reviewed and updated as appropriate: allergies, current medications, past family history, past medical history, past social history, past surgical history and problem list.   Health Maintenance:  Normal pap- no transition zone present 10/2014.  Normal mammogram on 11/23/2014  Review of Systems:  Pertinent items noted in HPI and remainder of comprehensive ROS otherwise negative.  Objective:  Physical Exam BP 126/72 mmHg  Pulse 93  Temp(Src) 98.9 F (37.2 C) (Oral)  Ht 5\' 8"  (1.727 m)  Wt 243 lb 11.2 oz (110.542 kg)  BMI 37.06 kg/m2  LMP 12/29/2014 CONSTITUTIONAL: Well-developed, well-nourished female in no acute distress.  HENT:  Normocephalic, atraumatic. External right and left ear normal. Oropharynx is clear and moist EYES: Conjunctivae and EOM are normal. Pupils are equal, round, and reactive to light. No scleral icterus.  NECK: Normal range of motion, supple, no masses SKIN: Skin is warm and dry. No rash noted. Not diaphoretic. No erythema. No pallor. [place]: Alert and oriented to person, place, and time. Normal reflexes, muscle tone coordination. No cranial nerve deficit noted. PSYCHIATRIC: Normal mood and affect. Normal behavior. Normal judgment and thought content. CARDIOVASCULAR: Normal heart rate noted RESPIRATORY: Effort and breath sounds normal, no problems with respiration noted ABDOMEN: Soft, no distention noted.   PELVIC: Normal appearing genitalia. copious white discharge in vaginal vault with +fishy odor. Cervix appears normal with  no lesions. No CMT. No adnexal masses.   MUSCULOSKELETAL: Normal range of motion. No edema noted.  Labs and Imaging No results found.  Assessment & Plan:  1. Bacterial vaginosis - Wet prep - GC/CT - Recollected pap smear given there were no transitional zone cells on last pap - metroNIDAZOLE (FLAGYL) 500 MG tablet; Take 1 tablet (500 mg total) by mouth 2 (two) times daily.  Dispense: 14 tablet; Refill: 0   Routine preventative health maintenance measures emphasized. Please refer to After Visit Summary for other counseling recommendations.   Return if symptoms worsen or fail to improve.   Total face-to-face time with patient: 25 minutes. Over 50% of encounter was spent on counseling and coordination of care.

## 2015-01-20 ENCOUNTER — Telehealth: Payer: Self-pay | Admitting: *Deleted

## 2015-01-20 LAB — CYTOLOGY - PAP

## 2015-01-20 LAB — GC/CHLAMYDIA PROBE AMP (~~LOC~~) NOT AT ARMC
CHLAMYDIA, DNA PROBE: NEGATIVE
NEISSERIA GONORRHEA: NEGATIVE

## 2015-01-20 LAB — WET PREP, GENITAL
TRICH WET PREP: NONE SEEN
Yeast Wet Prep HPF POC: NONE SEEN

## 2015-01-20 NOTE — Telephone Encounter (Signed)
Spoke with patient via phone.  Results from wet prep given to patient.  Encouraged to fill prescription given by Dr. Ernestina Patches yesterday.  Patient states understanding.  Explained I would call her back next week with the rest of her results.  Patient states understanding.

## 2015-04-22 MED FILL — IBUPROFEN 800 MG TABLET: 800 | 27 days supply | Qty: 80 | Fill #1

## 2016-08-10 ENCOUNTER — Ambulatory Visit (HOSPITAL_COMMUNITY)
Admission: RE | Admit: 2016-08-10 | Discharge: 2016-08-10 | Disposition: A | Discharge status: Home / Self Care | Payer: Self-pay | Source: Ambulatory Visit | Attending: Family Medicine | Admitting: Family Medicine

## 2016-08-10 ENCOUNTER — Other Ambulatory Visit: Payer: Self-pay

## 2016-08-10 ENCOUNTER — Encounter: Payer: Self-pay | Admitting: Family Medicine

## 2016-08-10 ENCOUNTER — Ambulatory Visit: Payer: Self-pay | Attending: Family Medicine | Admitting: Family Medicine

## 2016-08-10 VITALS — BP 131/78 | HR 79 | Temp 98.2°F | Resp 18 | Ht 68.0 in | Wt 254.0 lb

## 2016-08-10 DIAGNOSIS — K219 Gastro-esophageal reflux disease without esophagitis: Secondary | ICD-10-CM | POA: Insufficient documentation

## 2016-08-10 DIAGNOSIS — M940 Chondrocostal junction syndrome [Tietze]: Secondary | ICD-10-CM | POA: Insufficient documentation

## 2016-08-10 DIAGNOSIS — D219 Benign neoplasm of connective and other soft tissue, unspecified: Secondary | ICD-10-CM | POA: Insufficient documentation

## 2016-08-10 DIAGNOSIS — Z9889 Other specified postprocedural states: Secondary | ICD-10-CM | POA: Insufficient documentation

## 2016-08-10 DIAGNOSIS — Z885 Allergy status to narcotic agent status: Secondary | ICD-10-CM | POA: Insufficient documentation

## 2016-08-10 DIAGNOSIS — Z13228 Encounter for screening for other metabolic disorders: Secondary | ICD-10-CM | POA: Insufficient documentation

## 2016-08-10 MED ORDER — OMEPRAZOLE 20 MG PO CPDR: 20.0000 mg | DELAYED_RELEASE_CAPSULE | Freq: Every day | ORAL | 3 refills | Status: DC

## 2016-08-10 MED ORDER — IBUPROFEN 600 MG PO TABS: 600.0000 mg | ORAL_TABLET | Freq: Two times a day (BID) | ORAL | 1 refills | Status: DC | PRN

## 2016-08-10 MED FILL — OMEPRAZOLE DR 20 MG CAPSULE: 20 | 30 days supply | Qty: 30 | Fill #0

## 2016-08-10 MED FILL — IBUPROFEN 600 MG TABLET: 600 | 30 days supply | Qty: 60 | Fill #0

## 2016-08-10 NOTE — Progress Notes (Signed)
Subjective:  Patient ID: Laurie Hull, female    DOB: 1965-11-27  Age: 51 y.o. MRN: [id number]  CC: Chest Pain   HPI Brean Carberry presents with complaints of right-sided chest pain which radiates to her right shoulder for the last 3 weeks with an associated feeling of the need to burp. She has used over-the-counter antacids with no relief in symptoms. Denies abdominal pain, nausea, vomiting, diarrhea or constipation She endorses eating late night meals as she works third shift. Denies shortness of breath, wheezing or pedal edema.  Past Medical History:  Diagnosis Date  . Fibroids     Past Surgical History:  Procedure Laterality Date  . MYOMECTOMY      Allergies  Allergen Reactions  . Oxycodone-Acetaminophen Itching and Rash     Outpatient Medications Prior to Visit  Medication Sig Dispense Refill  . ibuprofen (ADVIL,MOTRIN) 800 MG tablet Take 1 tablet (800 mg total) by mouth every 8 (eight) hours as needed. 80 tablet 2  . ferrous sulfate 325 (65 FE) MG tablet Take 325 mg by mouth daily with breakfast.    . Multiple Vitamins-Minerals (MULTIVITAMIN WITH MINERALS) tablet Take 1 tablet by mouth daily.    . Norethindrone Acetate-Ethinyl Estradiol (JUNEL,LOESTRIN,MICROGESTIN) 1.5-30 MG-MCG tablet Take 1 tablet by mouth daily. (Patient not taking: Reported on 01/19/2015) 1 Package 11  . metroNIDAZOLE (FLAGYL) 500 MG tablet Take 1 tablet (500 mg total) by mouth 2 (two) times daily. 14 tablet 0   No facility-administered medications prior to visit.     ROS Review of Systems  Constitutional: Negative for activity change, appetite change and fatigue.  HENT: Negative for congestion, sinus pressure and sore throat.   Eyes: Negative for visual disturbance.  Respiratory: Negative for cough, chest tightness, shortness of breath and wheezing.   Cardiovascular: Positive for chest pain. Negative for palpitations.  Gastrointestinal: Negative for abdominal distention, abdominal pain  and constipation.  Endocrine: Negative for polydipsia.  Genitourinary: Negative for dysuria and frequency.  Musculoskeletal: Negative for arthralgias and back pain.  Skin: Negative for rash.  Neurological: Negative for tremors, light-headedness and numbness.  Hematological: Does not bruise/bleed easily.  Psychiatric/Behavioral: Negative for agitation and behavioral problems.    Objective:  BP 131/78 (BP Location: Right Arm, Patient Position: Sitting, Cuff Size: Large)   Pulse 79   Temp 98.2 F (36.8 C) (Oral)   Resp 18   Ht 5' 8"  (1.727 m)   Wt 254 lb (115.2 kg)   LMP 07/25/2016   SpO2 100%   BMI 38.62 kg/m   BP/Weight 08/10/2016 74/94/4967 08/08/1636  Systolic BP 466 599 357  Diastolic BP 78 72 80  Wt. (Lbs) 254 243.7 249.5  BMI 38.62 37.06 37.94      Physical Exam  Constitutional: She is oriented to person, place, and time. She appears well-developed and well-nourished.  Cardiovascular: Normal rate, normal heart sounds and intact distal pulses.   No murmur heard. Pulmonary/Chest: Effort normal and breath sounds normal. She has no wheezes. She has no rales. She exhibits tenderness (tenderness to palpation of right chestwall).  Abdominal: Soft. Bowel sounds are normal. She exhibits no distension and no mass. There is no tenderness.  Musculoskeletal: Normal range of motion.  Neurological: She is alert and oriented to person, place, and time.  Skin: Skin is warm and dry.  Psychiatric: She has a normal mood and affect.     Assessment & Plan:   1. Gastroesophageal reflux disease without esophagitis Advised to avoid late meals Avoid recurrent position up  to 2 hours after meals - omeprazole (PRILOSEC) 20 MG capsule; Take 1 capsule (20 mg total) by mouth daily.  Dispense: 30 capsule; Refill: 3  2. Costochondritis EKG-normal sinus rhythm - ibuprofen (ADVIL,MOTRIN) 600 MG tablet; Take 1 tablet (600 mg total) by mouth every 12 (twelve) hours as needed.  Dispense: 60 tablet;  Refill: 1  3. Screening for metabolic disorder - [id number]; Future - Lipid panel; Future   Meds ordered this encounter  Medications  . omeprazole (PRILOSEC) 20 MG capsule    Sig: Take 1 capsule (20 mg total) by mouth daily.    Dispense:  30 capsule    Refill:  3  . ibuprofen (ADVIL,MOTRIN) 600 MG tablet    Sig: Take 1 tablet (600 mg total) by mouth every 12 (twelve) hours as needed.    Dispense:  60 tablet    Refill:  1    Follow-up: Return in about 3 weeks (around 08/31/2016) for Follow-up on costochondritis and GERD.   Arnoldo Morale MD

## 2016-08-10 NOTE — Progress Notes (Signed)
Patient is here for CHEST pain  Patient complains of right side chest pain beginning 07/14/16. Patient took OTC indigestion which provided no relief.  Patient request a refill on Iron supplement.

## 2016-08-10 NOTE — Patient Instructions (Signed)
Costochondritis Costochondritis is swelling and irritation (inflammation) of the tissue (cartilage) that connects your ribs to your breastbone (sternum). This causes pain in the front of your chest. The pain usually starts gradually and involves more than one rib. What are the causes? The exact cause of this condition is not always known. It results from stress on the cartilage where your ribs attach to your sternum. The cause of this stress could be:  Chest injury (trauma).  Exercise or activity, such as lifting.  Severe coughing. What increases the risk? You may be at higher risk for this condition if you:  Are female.  Are 30?51 years old.  Recently started a new exercise or work activity.  Have low levels of vitamin D.  Have a condition that makes you cough frequently. What are the signs or symptoms? The main symptom of this condition is chest pain. The pain:  Usually starts gradually and can be sharp or dull.  Gets worse with deep breathing, coughing, or exercise.  Gets better with rest.  May be worse when you press on the sternum-rib connection (tenderness). How is this diagnosed? This condition is diagnosed based on your symptoms, medical history, and a physical exam. Your health care provider will check for tenderness when pressing on your sternum. This is the most important finding. You may also have tests to rule out other causes of chest pain. These may include:  A chest X-ray to check for lung problems.  An electrocardiogram (ECG) to see if you have a heart problem that could be causing the pain.  An imaging scan to rule out a chest or rib fracture. How is this treated? This condition usually goes away on its own over time. Your health care provider may prescribe an NSAID to reduce pain and inflammation. Your health care provider may also suggest that you:  Rest and avoid activities that make pain worse.  Apply heat or cold to the area to reduce pain and  inflammation.  Do exercises to stretch your chest muscles. If these treatments do not help, your health care provider may inject a numbing medicine at the sternum-rib connection to help relieve the pain. Follow these instructions at home:  Avoid activities that make pain worse. This includes any activities that use chest, abdominal, and side muscles.  If directed, put ice on the painful area:  Put ice in a plastic bag.  Place a towel between your skin and the bag.  Leave the ice on for 20 minutes, 2-3 times a day.  If directed, apply heat to the affected area as often as told by your health care provider. Use the heat source that your health care provider recommends, such as a moist heat pack or a heating pad.  Place a towel between your skin and the heat source.  Leave the heat on for 20-30 minutes.  Remove the heat if your skin turns bright red. This is especially important if you are unable to feel pain, heat, or cold. You may have a greater risk of getting burned.  Take over-the-counter and prescription medicines only as told by your health care provider.  Return to your normal activities as told by your health care provider. Ask your health care provider what activities are safe for you.  Keep all follow-up visits as told by your health care provider. This is important. Contact a health care provider if:  You have chills or a fever.  Your pain does not go away or it gets worse.    You have a cough that does not go away (is persistent). Get help right away if:  You have shortness of breath. This information is not intended to replace advice given to you by your health care provider. Make sure you discuss any questions you have with your health care provider. Document Released: 12/27/2004 Document Revised: 10/07/2015 Document Reviewed: 07/13/2015 Elsevier Interactive Patient Education  2017 Elsevier Inc.  

## 2016-08-14 ENCOUNTER — Other Ambulatory Visit: Payer: Self-pay

## 2016-08-16 ENCOUNTER — Other Ambulatory Visit: Payer: Self-pay

## 2016-09-11 ENCOUNTER — Ambulatory Visit: Payer: Self-pay | Admitting: Family Medicine

## 2016-10-21 IMAGING — US US PELVIS COMPLETE
1 series · 13 of 25 positions shown · non-contrast
Comparison: 12/02/2007 CT abdomen/ pelvis. 04/14/2004 pelvic
sonogram.

CLINICAL DATA: 49-year-old female presenting for follow-up of
uterine fibroids, with enlarged uterus on physical exam and
menorrhagia. History of myomectomy in 8068 (reported removal of 32
fibroids). LMP 11/02/2014, day 2 of menstrual cycle .



[Series 1: us pelvis complete · 64 acquisitions, 13 frames shown]
[im 1/64]
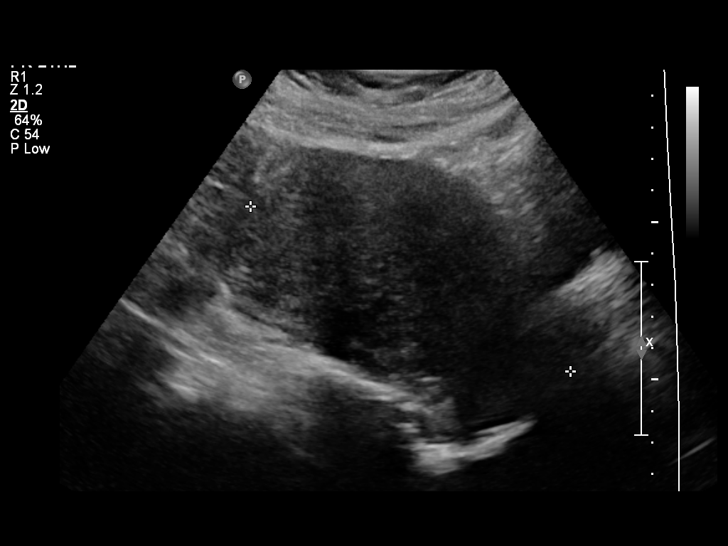
[im 6/64]
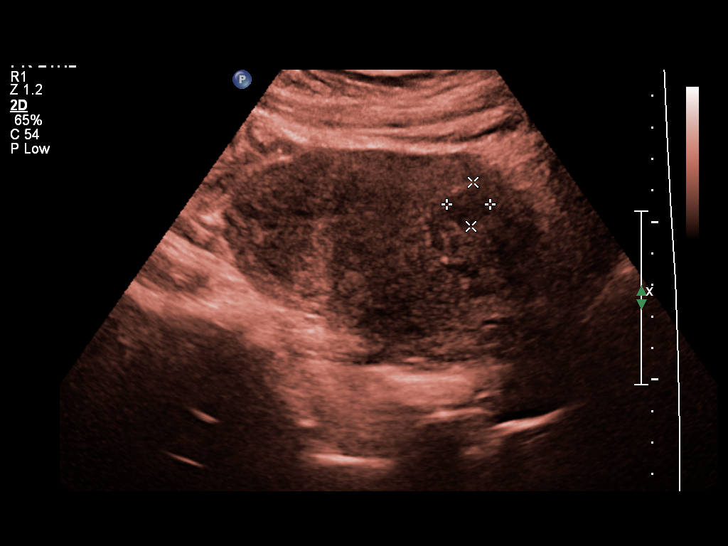
[im 11/64]
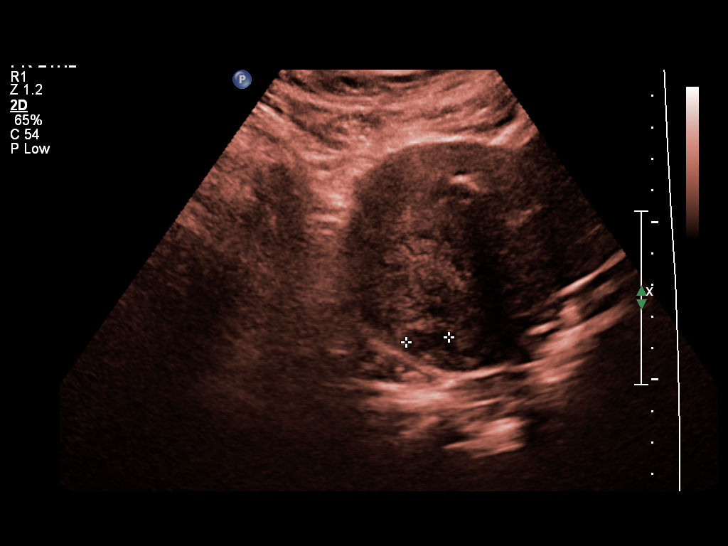
[im 16/64]
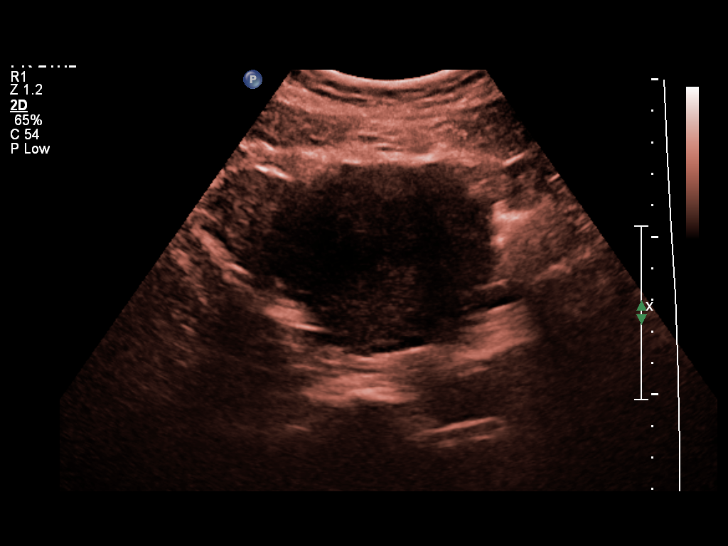
[im 22/64]
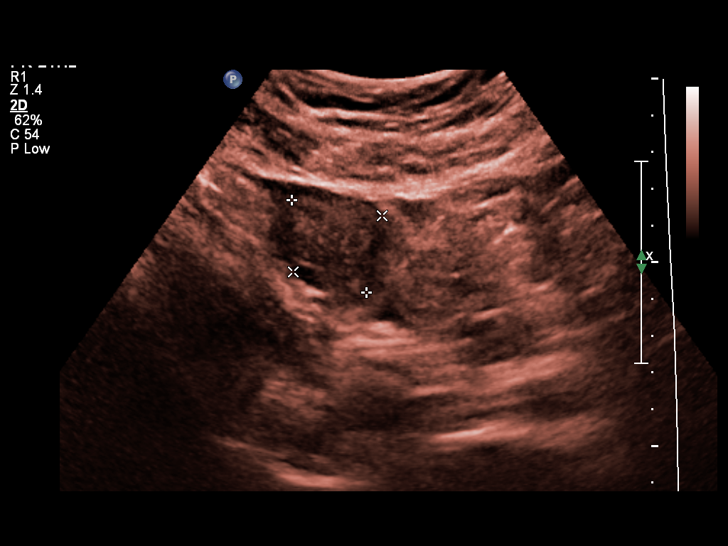
[im 27/64]
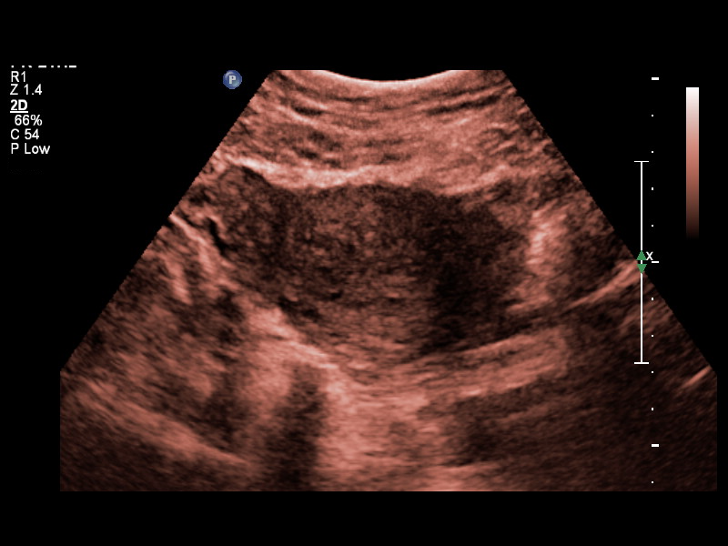
[im 32/64]
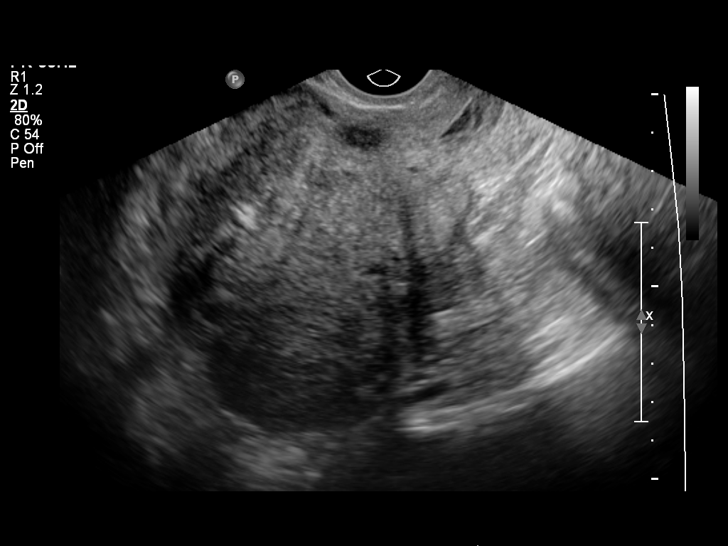
[im 37/64]
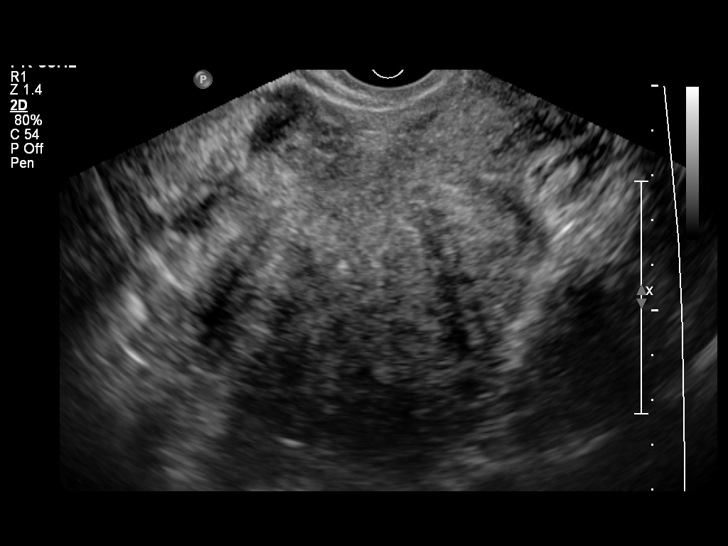
[im 43/64]
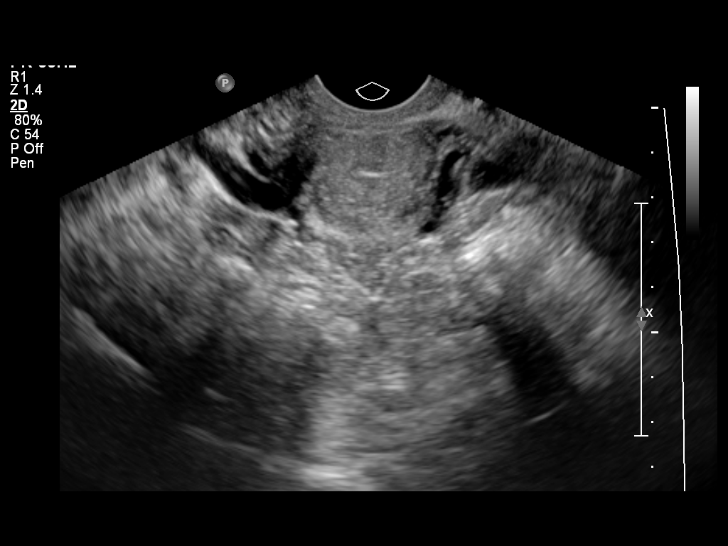
[im 48/64]
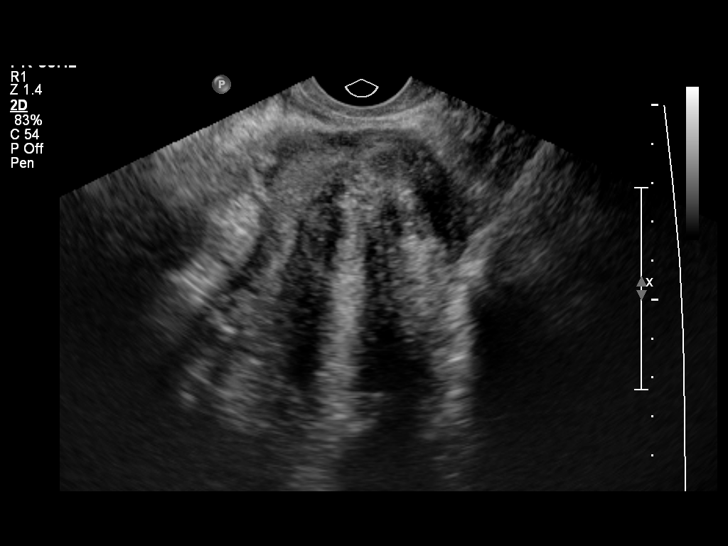
[im 53/64]
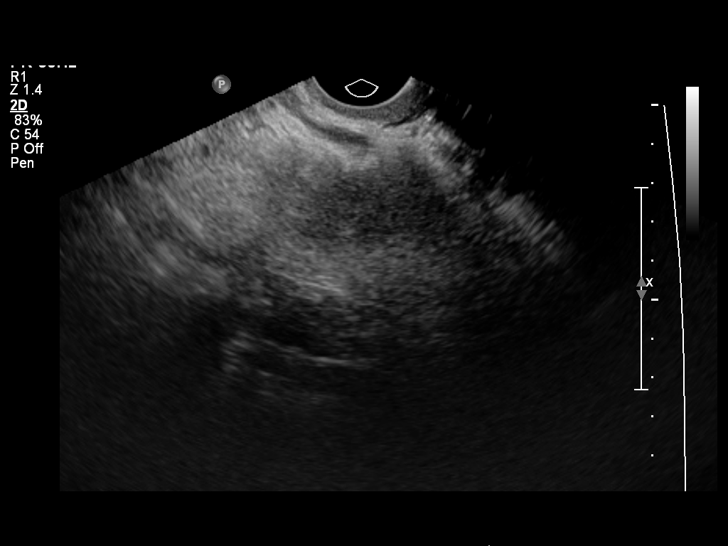
[im 58/64]
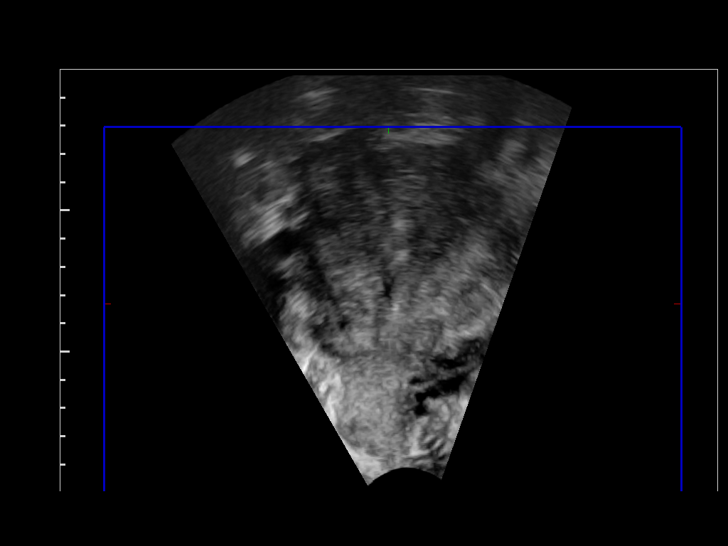
[im 64/64]
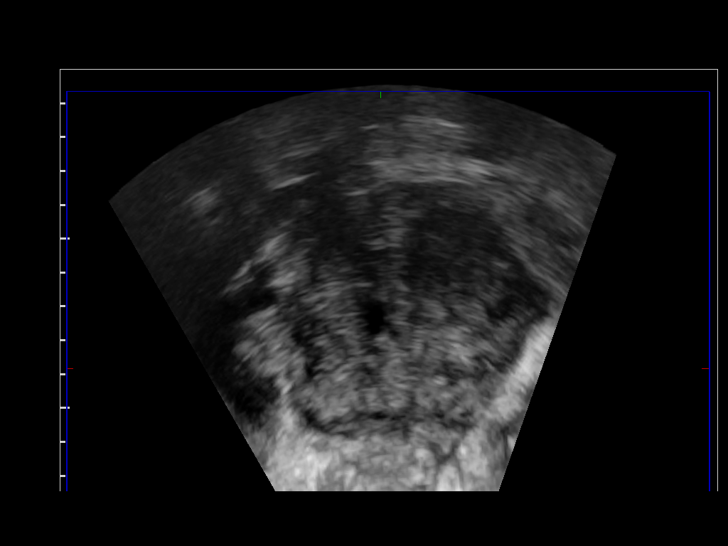

[13 of 25 positions shown; findings below may reference images not displayed]

FINDINGS: Uterus

Measurements: 12.2 x 7.4 x 6.9. The anteverted uterus is moderately
enlarged and globular in confirmation. The myometrium is diffusely
heterogeneous with scattered refractory myometrial shadowing and an
indistinct endometrial myometrial interface, suggesting underlying
diffuse adenomyosis. There are small uterine fibroids as follows:

-left anterior uterine body intramural 1.6 x 0.7 x 1.1 cm fibroid

-posterior uterine body intramural 1.4 x 1.2 x 1.4 cm fibroid

-posterior right lower uterine body intramural 1.5 x 1.2 x 1.4 cm
fibroid

At least 3 scattered subcentimeter coarse myometrial calcifications
in the uterus likely represent degenerated calcified fibroids.

Endometrium

Endometrium is poorly visualized due to suspected adenomyosis, and
accurate measurement of the bilayer endometrial thickness cannot be
obtained. No focal endometrial abnormality or endometrial cavity
fluid are demonstrated.

Right ovary

Measurements: 3.2 x 2.9 x 2.1 cm. Normal appearance/no adnexal mass.

Left ovary

Measurements: 3.3 x 1.8 x 1.8 cm. Normal appearance/no adnexal mass.

Other findings

No abnormal free fluid is seen in the pelvis.
IMPRESSION: 1. Moderately enlarged globular uterus with superimposed diffuse
adenomyosis and small intramural fibroids. No appreciable
submucosal/intracavitary fibroids
2. Nondiagnostic endometrial evaluation due to suspected
adenomyosis.
3. Normal ovaries.  No suspicious adnexal findings.

## 2017-02-11 ENCOUNTER — Ambulatory Visit: Payer: Self-pay

## 2017-03-20 ENCOUNTER — Ambulatory Visit: Payer: Self-pay | Attending: Family Medicine | Admitting: Family Medicine

## 2017-03-20 ENCOUNTER — Encounter: Payer: Self-pay | Admitting: Family Medicine

## 2017-03-20 ENCOUNTER — Other Ambulatory Visit: Payer: Self-pay

## 2017-03-20 ENCOUNTER — Emergency Department (HOSPITAL_COMMUNITY)
Admission: EM | Admit: 2017-03-20 | Discharge: 2017-03-20 | Disposition: A | Discharge status: Home / Self Care | Payer: Self-pay | Attending: Emergency Medicine | Admitting: Emergency Medicine

## 2017-03-20 ENCOUNTER — Encounter (HOSPITAL_COMMUNITY): Payer: Self-pay | Admitting: Emergency Medicine

## 2017-03-20 VITALS — BP 118/82 | HR 75 | Temp 99.1°F | Resp 16 | Wt 270.6 lb

## 2017-03-20 DIAGNOSIS — R51 Headache: Secondary | ICD-10-CM

## 2017-03-20 DIAGNOSIS — H53142 Visual discomfort, left eye: Secondary | ICD-10-CM | POA: Insufficient documentation

## 2017-03-20 DIAGNOSIS — H5710 Ocular pain, unspecified eye: Secondary | ICD-10-CM

## 2017-03-20 DIAGNOSIS — R519 Headache, unspecified: Secondary | ICD-10-CM

## 2017-03-20 DIAGNOSIS — M62838 Other muscle spasm: Secondary | ICD-10-CM | POA: Insufficient documentation

## 2017-03-20 DIAGNOSIS — Z83511 Family history of glaucoma: Secondary | ICD-10-CM | POA: Insufficient documentation

## 2017-03-20 DIAGNOSIS — H5712 Ocular pain, left eye: Secondary | ICD-10-CM | POA: Insufficient documentation

## 2017-03-20 DIAGNOSIS — Z79899 Other long term (current) drug therapy: Secondary | ICD-10-CM | POA: Insufficient documentation

## 2017-03-20 DIAGNOSIS — H538 Other visual disturbances: Secondary | ICD-10-CM | POA: Insufficient documentation

## 2017-03-20 MED ORDER — FLUORESCEIN SODIUM 1 MG OP STRP
1.0000 | ORAL_STRIP | Freq: Once | OPHTHALMIC | Status: AC
  Administered 2017-03-20: 1 via OPHTHALMIC
  Filled 2017-03-20: qty 1

## 2017-03-20 MED ORDER — TETRACAINE HCL 0.5 % OP SOLN
2.0000 [drp] | Freq: Once | OPHTHALMIC | Status: AC
  Administered 2017-03-20: 2 [drp] via OPHTHALMIC
  Filled 2017-03-20: qty 4

## 2017-03-20 MED ORDER — ACETAMINOPHEN 325 MG PO TABS
650.0000 mg | ORAL_TABLET | Freq: Once | ORAL | Status: AC
  Administered 2017-03-20: 650 mg via ORAL
  Filled 2017-03-20: qty 2

## 2017-03-20 MED ORDER — METHOCARBAMOL 500 MG PO TABS: 500.0000 mg | ORAL_TABLET | Freq: Two times a day (BID) | ORAL | 0 refills | Status: DC | PRN

## 2017-03-20 MED ORDER — NAPROXEN 375 MG PO TABS: 375.0000 mg | ORAL_TABLET | Freq: Two times a day (BID) | ORAL | 0 refills | Status: DC

## 2017-03-20 NOTE — ED Notes (Signed)
See PA Assessment

## 2017-03-20 NOTE — Progress Notes (Signed)
   Subjective:  Patient ID: Laurie Hull, female    DOB: 08-29-1965  Age: 51 y.o. MRN: [id number]  CC: Eye Pain   HPI Laurie Hull presents for eye complaint. Onset Sunday morning, sudden. Symptoms include left eye pain. She reports "stabbing-like" pain behind left eye.  Associated symptoms inc.photophobia, redness, tearing, headache. She denies any floaters, history of foreign objects in the eye, or  close contacts with similar symptoms. She reports using OTC moisturizing eye drops with minimal relief of symptoms. FH of glaucoma with blindness-mother. She reports mother was diagnosed with glaucoma in her 75's. She denies ever having an opthalmologic eye exam.    Outpatient Medications Prior to Visit  Medication Sig Dispense Refill  . ibuprofen (ADVIL,MOTRIN) 600 MG tablet Take 1 tablet (600 mg total) by mouth every 12 (twelve) hours as needed. 60 tablet 1  . Norethindrone Acetate-Ethinyl Estradiol (JUNEL,LOESTRIN,MICROGESTIN) 1.5-30 MG-MCG tablet Take 1 tablet by mouth daily. (Patient not taking: Reported on 01/19/2015) 1 Package 11  . omeprazole (PRILOSEC) 20 MG capsule Take 1 capsule (20 mg total) by mouth daily. (Patient not taking: Reported on 03/20/2017) 30 capsule 3  . ferrous sulfate 325 (65 FE) MG tablet Take 325 mg by mouth daily with breakfast.    . Multiple Vitamins-Minerals (MULTIVITAMIN WITH MINERALS) tablet Take 1 tablet by mouth daily.     No facility-administered medications prior to visit.     ROS Review of Systems  Constitutional: Negative.   HENT: Negative.   Eyes: Positive for photophobia, redness and visual disturbance.  Respiratory: Negative.   Cardiovascular: Negative.     Objective:  BP 118/82   Pulse 75   Temp 99.1 F (37.3 C) (Oral)   Resp 16   Wt 270 lb 9.6 oz (122.7 kg)   LMP 02/27/2017 (Approximate)   SpO2 97%   BMI 41.14 kg/m   BP/Weight 03/20/2017 03/20/2017 1/60/7371  Systolic BP 062 694 854  Diastolic BP 72 82 78  Wt. (Lbs) - 270.6 254   BMI - 41.14 38.62     Physical Exam  Constitutional: She appears well-developed and well-nourished.  HENT:  Head: Normocephalic and atraumatic.  Right Ear: External ear normal.  Left Ear: External ear normal.  Nose: Nose normal.  Mouth/Throat: Oropharynx is clear and moist.  Eyes: Conjunctivae and lids are normal. Pupils are equal, round, and reactive to light. Lids are everted and swept, no foreign bodies found. Right eye exhibits no discharge. Left eye exhibits no discharge and no exudate. No foreign body present in the left eye. No scleral icterus.  Left eye-redness.   Cardiovascular: Normal rate, regular rhythm, normal heart sounds and intact distal pulses.  Pulmonary/Chest: Effort normal and breath sounds normal.  Abdominal: Soft. Bowel sounds are normal.  Nursing note and vitals reviewed.    Assessment & Plan:   1. Acute eye pain Recommend urgent referral to ophthalmologist or ED based on symptoms and patient's family history.  Patient declines referral to [facility]. Recommend she go to ED.  - Ambulatory referral to Ophthalmology  2. Blurred vision, left eye Visual acuity screen performed. - Ambulatory referral to Ophthalmology  3. Photophobia of left eye  - Ambulatory referral to Ophthalmology  4. Family history of glaucoma in mother  - Ambulatory referral to Ophthalmology    Follow-up: Return for Go to the ED.   Alfonse Spruce FNP

## 2017-03-20 NOTE — ED Notes (Signed)
Provider at the bedside.  

## 2017-03-20 NOTE — ED Provider Notes (Signed)
[facility] Provider Note   CSN: [id number] Arrival date & time: 03/20/17  1207     History   Chief Complaint Chief Complaint  Patient presents with  . Eye Pain    HPI Consepcion Hull is a 51 y.o. female.  Laurie Hull is a 51 y.o. Female who presents to the ED complaining of left eye pain for the past 4 days. She reports the pain in her left eye was very severe. She reports associated photophobia.  She denies any changes to her vision.  No loss of vision, double vision or black spots in her vision.  No floaters.  She reports some associated headache on the left side of her head that is in a hornlike distribution.  She also has some associated trapezius muscle pain.  She reports taking ibuprofen earlier today with some relief of her symptoms.  She has had no head injury or eye trauma.  No foreign body sensation to her eye.  She does not wear contacts or glasses.  She is not followed by an eye doctor.  She denies fevers, double vision, loss of vision, neck pain, abdominal pain, vomiting, head injury, chest pain, or rashes.   The history is provided by the patient and medical records. No language interpreter was used.  Eye Pain  Associated symptoms include headaches. Pertinent negatives include no chest pain, no abdominal pain and no shortness of breath.    Past Medical History:  Diagnosis Date  . Fibroids     Patient Active Problem List   Diagnosis Date Noted  . GERD (gastroesophageal reflux disease) 08/10/2016  . Costochondritis 08/10/2016  . Obesity 10/21/2014  . CRUSHING INJURY OF FINGER 10/15/2008  . KNEE PAIN, LEFT 05/25/2008  . FIBROIDS, UTERUS 11/29/2006    Past Surgical History:  Procedure Laterality Date  . HERNIA REPAIR    . MYOMECTOMY      OB History    No data available       Home Medications    Prior to Admission medications   Medication Sig Start Date End Date Taking? Authorizing Provider  methocarbamol  (ROBAXIN) 500 MG tablet Take 1 tablet (500 mg total) by mouth 2 (two) times daily as needed for muscle spasms. 03/20/17   Waynetta Pean, PA-C  naproxen (NAPROSYN) 375 MG tablet Take 1 tablet (375 mg total) by mouth 2 (two) times daily with a meal. 03/20/17   Waynetta Pean, PA-C  Norethindrone Acetate-Ethinyl Estradiol (JUNEL,LOESTRIN,MICROGESTIN) 1.5-30 MG-MCG tablet Take 1 tablet by mouth daily. Patient not taking: Reported on 01/19/2015 01/04/15   Clemmons, Bertram Gala, CNM  omeprazole (PRILOSEC) 20 MG capsule Take 1 capsule (20 mg total) by mouth daily. Patient not taking: Reported on 03/20/2017 08/10/16   Arnoldo Morale, MD    Family History Family History  Problem Relation Age of Onset  . Diabetes Mother   . Hypertension Mother   . Heart disease Father   . Hypertension Father     Social History Social History   Tobacco Use  . Smoking status: Never Smoker  . Smokeless tobacco: Never Used  . Tobacco comment: smoke weed   Substance Use Topics  . Alcohol use: Yes    Alcohol/week: 0.0 oz    Comment: occ  . Drug use: Yes    Types: Marijuana     Allergies   Oxycodone-acetaminophen   Review of Systems Review of Systems  Constitutional: Negative for chills and fever.  HENT: Positive for rhinorrhea. Negative for congestion and sore  throat.   Eyes: Positive for photophobia and pain. Negative for discharge, redness, itching and visual disturbance.  Respiratory: Negative for cough and shortness of breath.   Cardiovascular: Negative for chest pain.  Gastrointestinal: Negative for abdominal pain, diarrhea, nausea and vomiting.  Genitourinary: Negative for dysuria.  Musculoskeletal: Negative for back pain and neck pain.  Skin: Negative for rash.  Neurological: Positive for headaches. Negative for dizziness, syncope, weakness, light-headedness and numbness.     Physical Exam Updated Vital Signs BP (!) 155/106 (BP Location: Right Arm)   Pulse 78   Temp 98.7 F (37.1 C) (Oral)    Resp 19   LMP 02/27/2017 (Approximate)   SpO2 100%   Physical Exam  Constitutional: She is oriented to person, place, and time. She appears well-developed and well-nourished. No distress.  HENT:  Head: Normocephalic and atraumatic.  Right Ear: External ear normal.  Left Ear: External ear normal.  Mouth/Throat: Oropharynx is clear and moist.  Eyes: Conjunctivae and EOM are normal. Pupils are equal, round, and reactive to light. Right eye exhibits no discharge. Left eye exhibits no discharge.  Bilateral eyes were anesthetized with tetracaine and intraocular pressures were obtained with Tono-Pen.  Right eye pressure is 13 mmHg and left eye pressure is 15 mmHg. Left eye pressure was checked twice to confirm.  Left eye was also stained with fluorescein and no fluorescein uptake on exam. EOMs intact. Vision is grossly intact.   Neck: Neck supple.  Cardiovascular: Normal rate, regular rhythm, normal heart sounds and intact distal pulses.  Pulmonary/Chest: Effort normal and breath sounds normal. No respiratory distress.  Abdominal: Soft. There is no tenderness.  Musculoskeletal: She exhibits tenderness. She exhibits no edema.  Tenderness across her left trapezius musculature.  Lymphadenopathy:    She has no cervical adenopathy.  Neurological: She is alert and oriented to person, place, and time. No cranial nerve deficit or sensory deficit. She exhibits normal muscle tone. Coordination normal.  Patient is alert and oriented 3. Speech is clear and coherent. Cranial nerves are intact. Sensation and strength is intact to bilateral upper and lower extremities.   Skin: Skin is warm and dry. Capillary refill takes less than 2 seconds. No rash noted. She is not diaphoretic. No erythema. No pallor.  Psychiatric: She has a normal mood and affect. Her behavior is normal.  Nursing note and vitals reviewed.    ED Treatments / Results  Labs (all labs ordered are listed, but only abnormal results are  displayed) Labs Reviewed - No data to display  EKG  EKG Interpretation None       Radiology No results found.  Procedures Procedures (including critical care time)    Visual Acuity  Right Eye Distance: 10/16 Left Eye Distance: 10/8 Bilateral Distance: 10/8  Right Eye Near:   Left Eye Near:    Bilateral Near:      Medications Ordered in ED Medications  acetaminophen (TYLENOL) tablet 650 mg (not administered)  tetracaine (PONTOCAINE) 0.5 % ophthalmic solution 2 drop (2 drops Left Eye Given 03/20/17 1703)  fluorescein ophthalmic strip 1 strip (1 strip Left Eye Given 03/20/17 1703)     Initial Impression / Assessment and Plan / ED Course  I have reviewed the triage vital signs and the nursing notes.  Pertinent labs & imaging results that were available during my care of the patient were reviewed by me and considered in my medical decision making (see chart for details).    This is a 51 y.o. Female who presents  to the ED complaining of left eye pain for the past 4 days. She reports the pain in her left eye was very severe. She reports associated photophobia.  She denies any changes to her vision.  No loss of vision, double vision or black spots in her vision.  No floaters.  She reports some associated headache on the left side of her head that is in a hornlike distribution.  She also has some associated trapezius muscle pain.  She reports taking ibuprofen earlier today with some relief of her symptoms.  She has had no head injury or eye trauma.  No foreign body sensation to her eye.  She does not wear contacts or glasses.  She is not followed by an eye doctor.  On exam the patient is afebrile nontoxic-appearing.  Intraocular pressures were checked with Tono-Pen and these are normal bilaterally.  Good visual acuity bilaterally as above.  No fluorescein uptake on exam.  Patient does describe slight headache and a hornlike distribution across the left side of her head.  She has no  temporal edema or tenderness.  She does have tenderness across her left trapezius musculature.  Question if she is having tension type headache which is contributing or causing her left eye pain.  She reports feeling better after tetracaine anesthesia.  No evidence of acute angle-closure glaucoma.  Workup here is reassuring.  I discussed options to help with her symptoms today.  She is reassured by exam and would like to go home more quickly.  Will provide patient with Tylenol and discharge with Robaxin and naproxen.  I discussed stress reducing methods and massage to her trapezius muscle to see if this helps with her symptoms.  I provided her with follow-up with ophthalmology. I advised the patient to follow-up with their primary care provider this week. I advised the patient to return to the emergency department with new or worsening symptoms or new concerns. The patient verbalized understanding and agreement with plan.      Final Clinical Impressions(s) / ED Diagnoses   Final diagnoses:  Left eye pain  Trapezius muscle spasm  Bad headache    ED Discharge Orders        Ordered    naproxen (NAPROSYN) 375 MG tablet  2 times daily with meals     03/20/17 1740    methocarbamol (ROBAXIN) 500 MG tablet  2 times daily PRN     03/20/17 1740       Waynetta Pean, PA-C 03/20/17 [place], Ankit, MD 03/21/17 2316

## 2017-03-20 NOTE — ED Notes (Signed)
   Visual accuity:  Bilateral: 10/8 R eye: 10/16 L eye: 10/8

## 2017-03-20 NOTE — ED Triage Notes (Signed)
Pt reports excruciating L eye pain starting Sunday, denies discharge, injury, trauma.  Pt reports mother lost vision in both eyes d/t glaucoma.  Pt sent by [facility]. Unable to assess pupillary rxn in triage d/t pain.

## 2017-03-20 NOTE — Patient Instructions (Addendum)
Go to the Emergency Department today, if unable to see ophthalmologist today.

## 2017-03-22 MED FILL — NAPROXEN 375 MG TABLET: 375 | 15 days supply | Qty: 30 | Fill #0

## 2017-03-22 MED FILL — METHOCARBAMOL 500 MG TABS: 500 | 10 days supply | Qty: 20 | Fill #0

## 2017-05-27 ENCOUNTER — Ambulatory Visit: Payer: Self-pay | Attending: Family Medicine

## 2018-02-01 ENCOUNTER — Ambulatory Visit (HOSPITAL_COMMUNITY)
Admission: EM | Admit: 2018-02-01 | Discharge: 2018-02-01 | Disposition: A | Discharge status: Home / Self Care | Payer: Self-pay | Attending: Emergency Medicine | Admitting: Emergency Medicine

## 2018-02-01 ENCOUNTER — Encounter (HOSPITAL_COMMUNITY): Payer: Self-pay

## 2018-02-01 DIAGNOSIS — M795 Residual foreign body in soft tissue: Secondary | ICD-10-CM

## 2018-02-01 DIAGNOSIS — S70351A Superficial foreign body, right thigh, initial encounter: Secondary | ICD-10-CM

## 2018-02-01 NOTE — ED Provider Notes (Signed)
[name]    CSN: [id number] Arrival date & time: 02/01/18  1129     History   Chief Complaint Chief Complaint  Patient presents with  . Foreign Body    HPI Laurie Hull is a 52 y.o. female.   Laurie Hull presents with complaints of needle lodged into her right thigh. She was cleaning in her home and had set things on what she was seated on, sat down and felt pain, small amount of bleeding, realizing she had sat down on a sewing needle which was imbedded into her thigh. She tried using tweezers to extract but was unable to pull it out. Can feel the needle under the skin. It is painful. This occurred in the past hour prior to arrival. States she has had a Tdap in the past 5 years. No numbness or tingling. Needle is palpable but not visible. Hx of gerd, knee pain, fibroids.     ROS per HPI.      Past Medical History:  Diagnosis Date  . Fibroids     Patient Active Problem List   Diagnosis Date Noted  . GERD (gastroesophageal reflux disease) 08/10/2016  . Costochondritis 08/10/2016  . Obesity 10/21/2014  . CRUSHING INJURY OF FINGER 10/15/2008  . KNEE PAIN, LEFT 05/25/2008  . FIBROIDS, UTERUS 11/29/2006    Past Surgical History:  Procedure Laterality Date  . HERNIA REPAIR    . MYOMECTOMY      OB History   None      Home Medications    Prior to Admission medications   Medication Sig Start Date End Date Taking? Authorizing Provider  methocarbamol (ROBAXIN) 500 MG tablet Take 1 tablet (500 mg total) by mouth 2 (two) times daily as needed for muscle spasms. 03/20/17   Waynetta Pean, PA-C  naproxen (NAPROSYN) 375 MG tablet Take 1 tablet (375 mg total) by mouth 2 (two) times daily with a meal. 03/20/17   Waynetta Pean, PA-C  Norethindrone Acetate-Ethinyl Estradiol (JUNEL,LOESTRIN,MICROGESTIN) 1.5-30 MG-MCG tablet Take 1 tablet by mouth daily. Patient not taking: Reported on 01/19/2015 01/04/15   Clemmons, Bertram Gala, CNM  omeprazole (PRILOSEC) 20 MG  capsule Take 1 capsule (20 mg total) by mouth daily. Patient not taking: Reported on 03/20/2017 08/10/16   Charlott Rakes, MD    Family History Family History  Problem Relation Age of Onset  . Diabetes Mother   . Hypertension Mother   . Heart disease Father   . Hypertension Father     Social History Social History   Tobacco Use  . Smoking status: Never Smoker  . Smokeless tobacco: Never Used  . Tobacco comment: smoke weed   Substance Use Topics  . Alcohol use: Yes    Alcohol/week: 0.0 standard drinks    Comment: occ  . Drug use: Yes    Types: Marijuana     Allergies   Oxycodone-acetaminophen   Review of Systems Review of Systems   Physical Exam Triage Vital Signs ED Triage Vitals  Enc Vitals Group     BP 02/01/18 1148 (!) 147/82     Pulse Rate 02/01/18 1148 98     Resp 02/01/18 1148 20     Temp 02/01/18 1148 98.2 F (36.8 C)     Temp src --      SpO2 02/01/18 1148 100 %     Weight --      Height --      Head Circumference --      Peak Flow --  Pain Score 02/01/18 1146 7     Pain Loc --      Pain Edu? --      Excl. in [place]? --    No data found.  Updated Vital Signs BP (!) 147/82   Pulse 98   Temp 98.2 F (36.8 C)   Resp 20   SpO2 100%   Visual Acuity Right Eye Distance:   Left Eye Distance:   Bilateral Distance:    Right Eye Near:   Left Eye Near:    Bilateral Near:     Physical Exam  Constitutional: She is oriented to person, place, and time. She appears well-developed and well-nourished. No distress.  Cardiovascular: Normal rate, regular rhythm and normal heart sounds.  Pulmonary/Chest: Effort normal and breath sounds normal.  Musculoskeletal:       Legs: Right lateral thigh with palpable foreign body- thin sewing needle-  under skin in subcutaneous tissue; no visible foreign body, small area of skin breakdown noted from patient previous attempt to remove sewing needle; no drainage; no bleeding; mildly tender   Neurological: She is  alert and oriented to person, place, and time.  Skin: Skin is warm and dry.     UC Treatments / Results  Labs (all labs ordered are listed, but only abnormal results are displayed) Labs Reviewed - No data to display  EKG None  Radiology No results found.  Procedures Foreign Body Removal Date/Time: 02/01/2018 12:28 PM Performed by: Zigmund Gottron, NP Authorized by: Zigmund Gottron, NP   Consent:    Consent obtained:  Verbal   Consent given by:  Patient   Risks discussed:  Bleeding, incomplete removal, infection, pain, worsening of condition and poor cosmetic result   Alternatives discussed:  No treatment, observation and referral Location:    Location:  Leg   Leg location:  R thigh   Depth:  Subcutaneous   Tendon involvement:  None Pre-procedure details:    Imaging:  None   Neurovascular status: intact   Anesthesia (see MAR for exact dosages):    Anesthesia method:  Local infiltration   Local anesthetic:  Lidocaine 2% WITH epi Procedure type:    Procedure complexity:  Simple Procedure details:    Scalpel size:  11 (2 incisions approximately 0.5 cm apart, one pinpoint and one approximately 0.3 cm in length )   Incision length:  .3   Localization method:  Probed   Dissection of underlying tissues: no     Bloodless field: yes     Removal mechanism:  Hemostat   Foreign bodies recovered:  1   Description:  1 sewing needle    Intact foreign body removal: yes   Post-procedure details:    Neurovascular status: intact     Confirmation:  No additional foreign bodies on visualization   Skin closure:  Steri-Strips   Number of Steri-Strips:  5   Dressing:  Adhesive bandage   Patient tolerance of procedure:  Tolerated well, no immediate complications   (including critical care time)  Medications Ordered in UC Medications - No data to display  Initial Impression / Assessment and Plan / UC Course  I have reviewed the triage vital signs and the nursing  notes.  Pertinent labs & imaging results that were available during my care of the patient were reviewed by me and considered in my medical decision making (see chart for details).     Sewing needle extracted, patient tolerated well. Closure of small incision with steri strips, close proximity  of edges. Wound care education provided. Patient verbalized understanding and agreeable to plan.    Final Clinical Impressions(s) / UC Diagnoses   Final diagnoses:  Foreign body (FB) in soft tissue     Discharge Instructions     Keep dressing on for the next 24 hours.  Replace daily starting tomorrow.  Allow steri strips to eventually fall off, do not peel off.  Once off cleanse area with soap and water daily, keep covered to keep clean until area has healed.  Return for any signs of infection-redness, swelling, increased pain, drainage.  Ice, elevation, ibuprofen for pain control   ED Prescriptions    None     Controlled Substance Prescriptions [place] Controlled Substance Registry consulted? Not Applicable   Zigmund Gottron, NP 02/01/18 1231

## 2018-02-01 NOTE — ED Triage Notes (Signed)
Pt presents with complaints of of having 2 inch sewing needle stuck in thigh. Object is not visible. No bleeding present. Pt is having discomfort in the area. Per Lanelle Bal NP we can make an initial attempt to retrieve needle. Patient states she was cleaning out a box and laid it on the bed, states she must have accidentally sat on/ near it.

## 2018-02-01 NOTE — Discharge Instructions (Signed)
Keep dressing on for the next 24 hours.  Replace daily starting tomorrow.  Allow steri strips to eventually fall off, do not peel off.  Once off cleanse area with soap and water daily, keep covered to keep clean until area has healed.  Return for any signs of infection-redness, swelling, increased pain, drainage.  Ice, elevation, ibuprofen for pain control

## 2019-12-10 ENCOUNTER — Ambulatory Visit (INDEPENDENT_AMBULATORY_CARE_PROVIDER_SITE_OTHER): Payer: 59

## 2019-12-10 ENCOUNTER — Ambulatory Visit (HOSPITAL_COMMUNITY)
Admission: EM | Admit: 2019-12-10 | Discharge: 2019-12-10 | Disposition: A | Discharge status: Home / Self Care | Payer: 59 | Attending: Emergency Medicine | Admitting: Emergency Medicine

## 2019-12-10 ENCOUNTER — Encounter (HOSPITAL_COMMUNITY): Payer: Self-pay | Admitting: Emergency Medicine

## 2019-12-10 ENCOUNTER — Other Ambulatory Visit: Payer: Self-pay

## 2019-12-10 DIAGNOSIS — S8991XD Unspecified injury of right lower leg, subsequent encounter: Secondary | ICD-10-CM | POA: Diagnosis not present

## 2019-12-10 DIAGNOSIS — M25461 Effusion, right knee: Secondary | ICD-10-CM

## 2019-12-10 DIAGNOSIS — S82121A Displaced fracture of lateral condyle of right tibia, initial encounter for closed fracture: Secondary | ICD-10-CM

## 2019-12-10 MED ORDER — HYDROCODONE-ACETAMINOPHEN 5-325 MG PO TABS: 1.0000 | ORAL_TABLET | Freq: Four times a day (QID) | ORAL | 0 refills | Status: DC | PRN

## 2019-12-10 MED ORDER — IBUPROFEN 600 MG PO TABS: 600.0000 mg | ORAL_TABLET | Freq: Four times a day (QID) | ORAL | 0 refills | Status: DC | PRN

## 2019-12-10 MED ORDER — HYDROCODONE-ACETAMINOPHEN 5-325 MG PO TABS
2.0000 | ORAL_TABLET | Freq: Once | ORAL | Status: AC
  Administered 2019-12-10: 2 via ORAL

## 2019-12-10 MED ORDER — KETOROLAC TROMETHAMINE 30 MG/ML IJ SOLN
INTRAMUSCULAR | Status: AC
  Filled 2019-12-10: qty 1

## 2019-12-10 MED ORDER — KETOROLAC TROMETHAMINE 30 MG/ML IJ SOLN
30.0000 mg | Freq: Once | INTRAMUSCULAR | Status: AC
  Administered 2019-12-10: 30 mg via INTRAMUSCULAR

## 2019-12-10 MED ORDER — HYDROCODONE-ACETAMINOPHEN 5-325 MG PO TABS
ORAL_TABLET | ORAL | Status: AC
  Filled 2019-12-10: qty 2

## 2019-12-10 NOTE — Discharge Instructions (Addendum)
I have ordered a CT of your right knee without contrast to help the orthopedic surgeon out and expedite your care.  Someone should contact you and schedule this for today or tomorrow.  Follow-up with Dr. Stann Mainland at [facility] a day or 2 after getting the CT.  Keep the knee immobilizer on,, crutches, no weightbearing.  Take 600 mg of ibuprofen combined with 1000 mg of Tylenol for mild to moderate pain, or 600 mg of ibuprofen combined with 1 or 2 Norco for severe pain.  Ice, elevate.

## 2019-12-10 NOTE — ED Provider Notes (Signed)
HPI  SUBJECTIVE:  Laurie Hull is a 54 y.o. female who presents with right knee pain after having a misstep and fall 2 days ago.  States that she twisted her knee.  She reports immediate swelling and inability to bear weight.  She does not remember if there was a pop or not.  She did not land directly on her knee.  She describes throbbing sharp constant soreness along the superior and medial aspect of the knee.  She reports distal numbness.  States that her ankle and hip are without injury.  She has tried ice, elevation, and Aleve without improvement in her symptoms.  Symptoms are worse with movement and trying to stand.  Past medical history negative for right knee injury, diabetes, hypertension chronic kidney disease.  PMD: Amenorrheic.  Denies possibility of being pregnant.  PMD: None.   Past Medical History:  Diagnosis Date  . Fibroids     Past Surgical History:  Procedure Laterality Date  . HERNIA REPAIR    . MYOMECTOMY      Family History  Problem Relation Age of Onset  . Diabetes Mother   . Hypertension Mother   . Heart disease Father   . Hypertension Father     Social History   Tobacco Use  . Smoking status: Never Smoker  . Smokeless tobacco: Never Used  . Tobacco comment: smoke weed   Vaping Use  . Vaping Use: Never used  Substance Use Topics  . Alcohol use: Yes    Alcohol/week: 0.0 standard drinks    Comment: occ  . Drug use: Yes    Types: Marijuana    No current facility-administered medications for this encounter.  Current Outpatient Medications:  .  HYDROcodone-acetaminophen (NORCO/VICODIN) 5-325 MG tablet, Take 1-2 tablets by mouth every 6 (six) hours as needed for moderate pain or severe pain., Disp: 12 tablet, Rfl: 0 .  ibuprofen (ADVIL) 600 MG tablet, Take 1 tablet (600 mg total) by mouth every 6 (six) hours as needed., Disp: 30 tablet, Rfl: 0 .  Norethindrone Acetate-Ethinyl Estradiol (JUNEL,LOESTRIN,MICROGESTIN) 1.5-30 MG-MCG tablet, Take 1 tablet  by mouth daily. (Patient not taking: Reported on 01/19/2015), Disp: 1 Package, Rfl: 11 .  omeprazole (PRILOSEC) 20 MG capsule, Take 1 capsule (20 mg total) by mouth daily. (Patient not taking: Reported on 03/20/2017), Disp: 30 capsule, Rfl: 3  Allergies  Allergen Reactions  . Oxycodone-Acetaminophen Itching and Rash     ROS  As noted in HPI.   Physical Exam  BP (!) 153/98   Pulse 84   Temp 98.4 F (36.9 C) (Oral)   Resp 16   SpO2 100%   Constitutional: Well developed, well nourished, moderate painful distress.  Crying. Eyes:  EOMI, conjunctiva normal bilaterally HENT: Normocephalic, atraumatic,mucus membranes moist Respiratory: Normal inspiratory effort Cardiovascular: Normal rate GI: nondistended skin: No rash, skin intact Musculoskeletal: R Knee ROM decreased due to pain, Flexion  intact but very painful.  Patella NT,  Patellar tendon tender, Medial joint tender, Lateral joint NT, posterior lateral horn of meniscus tender.  Popliteal region otherwise NT, Varus MCL stress testing stable, Valgus LCL stress testing stable, McMurray's testing abnormal, Lachman's negative. Distal NVI with intact baseline sensation / motor / pulse distal to knee.  positive effusion. No erythema. No increased temperature. No crepitus.  Neurologic: Alert & oriented x 3, no focal neuro deficits Psychiatric: Speech and behavior appropriate   ED Course   Medications  ketorolac (TORADOL) 30 MG/ML injection 30 mg (30 mg Intramuscular Given 12/10/19 1124)  HYDROcodone-acetaminophen (NORCO/VICODIN) 5-325 MG per tablet 2 tablet (2 tablets Oral Given 12/10/19 1123)    Orders Placed This Encounter  Procedures  . DG Knee Complete 4 Views Right    Standing Status:   Standing    Number of Occurrences:   1    Order Specific Question:   Reason for Exam (SYMPTOM  OR DIAGNOSIS REQUIRED)    Answer:   Fall, inability to walk on it . r/o fracture, effusion  . CT KNEE RIGHT WO CONTRAST    Standing Status:   Future     Standing Expiration Date:   01/09/2020    Order Specific Question:   Is patient pregnant?    Answer:   No    Order Specific Question:   Preferred imaging location?    Answer:   [facility]    Order Specific Question:   Call Results- Best Contact Number?    Answer:   Dr. Stann Mainland at [facility]    Order Specific Question:   Radiology Contrast Protocol - do NOT remove file path    Answer:   [email or web address]  . Crutches    Standing Status:   Standing    Number of Occurrences:   1  . Apply knee immobilizer    Standing Status:   Standing    Number of Occurrences:   1    Order Specific Question:   Laterality    Answer:   Right    Order Specific Question:   Knee Immobilizer Instruction    Answer:   At all times except when in CPM/PT    No results found for this or any previous visit (from the past 24 hour(s)). DG Knee Complete 4 Views Right  Result Date: 12/10/2019 CLINICAL DATA:  Fall with inability to walk. EXAM: RIGHT KNEE - COMPLETE 4+ VIEW COMPARISON:  None. FINDINGS: Large knee joint effusion. There is a double density at the lateral tibial plateau. No depression along the articular surface, the fracture is likely at the rim. Subjective osteopenia for age. IMPRESSION: Lateral tibial plateau fracture with large joint effusion. Electronically Signed   By: Monte Fantasia M.D.   On: 12/10/2019 12:02    ED Clinical Impression  1. Closed fracture of lateral portion of right tibial plateau, initial encounter   2. Effusion of right knee      ED Assessment/Plan  Patient crying, seems to be in a lot of pain, will give Toradol 30 mg and 2 Norco.  Patient states Norco works well for her.  ordering x-ray of knee.  [place] Narcotic database reviewed for this patient, and feel that the risk/benefit ratio today is favorable for proceeding with a prescription for controlled substance.  Last narcotic prescription in August 2020 for tramadol.  Reviewed  imaging independently.  Lateral tibial plateau fracture with large effusion.  See radiology report for full details.    1216 discussed with [facility] RN, she will contact Dr. Theda Sers or Dr. Stann Mainland.  Discussed with Dr. Stann Mainland , Ortho on-call.  Agrees with knee immobilizer, crutches, partial weightbearing, Tylenol/ibuprofen, Norco/ibuprofen.  Ordering CT of the right knee without contrast to expedite her care.  Dr. Stann Mainland will follow up the results.  Plan: Knee immobilization, crutches, Norco/ibuprofen for severe pain, Tylenol/ibuprofen for mild to moderate pain.  Ice, elevate, follow-up with [facility] within 48 hours.  Work note for 2 days.  Discussed imaging, MDM, treatment plan, and plan for follow-up with patient. Discussed sn/sx that should prompt return to the ED. patient  agrees with plan.   Meds ordered this encounter  Medications  . ketorolac (TORADOL) 30 MG/ML injection 30 mg  . HYDROcodone-acetaminophen (NORCO/VICODIN) 5-325 MG per tablet 2 tablet  . ibuprofen (ADVIL) 600 MG tablet    Sig: Take 1 tablet (600 mg total) by mouth every 6 (six) hours as needed.    Dispense:  30 tablet    Refill:  0  . HYDROcodone-acetaminophen (NORCO/VICODIN) 5-325 MG tablet    Sig: Take 1-2 tablets by mouth every 6 (six) hours as needed for moderate pain or severe pain.    Dispense:  12 tablet    Refill:  0    *This clinic note was created using [employer]. Therefore, there may be occasional mistakes despite careful proofreading.   ?    Melynda Ripple, MD 12/12/19 [phone]

## 2019-12-10 NOTE — ED Triage Notes (Signed)
Fell 2 days ago on right knee. Right knee is swollen and painful, PT states she is unable to walk on it

## 2019-12-16 ENCOUNTER — Encounter (HOSPITAL_BASED_OUTPATIENT_CLINIC_OR_DEPARTMENT_OTHER): Payer: Self-pay

## 2019-12-16 ENCOUNTER — Ambulatory Visit (HOSPITAL_BASED_OUTPATIENT_CLINIC_OR_DEPARTMENT_OTHER): Payer: 59

## 2019-12-17 ENCOUNTER — Ambulatory Visit (INDEPENDENT_AMBULATORY_CARE_PROVIDER_SITE_OTHER): Payer: 59 | Admitting: Primary Care

## 2019-12-17 ENCOUNTER — Other Ambulatory Visit: Payer: Self-pay

## 2019-12-17 ENCOUNTER — Encounter (INDEPENDENT_AMBULATORY_CARE_PROVIDER_SITE_OTHER): Payer: Self-pay | Admitting: Primary Care

## 2019-12-17 VITALS — BP 148/85 | HR 77 | Temp 98.5°F | Ht 68.0 in | Wt 293.4 lb

## 2019-12-17 DIAGNOSIS — S82121D Displaced fracture of lateral condyle of right tibia, subsequent encounter for closed fracture with routine healing: Secondary | ICD-10-CM

## 2019-12-17 DIAGNOSIS — Z09 Encounter for follow-up examination after completed treatment for conditions other than malignant neoplasm: Secondary | ICD-10-CM | POA: Diagnosis not present

## 2019-12-17 DIAGNOSIS — Z7689 Persons encountering health services in other specified circumstances: Secondary | ICD-10-CM

## 2019-12-17 DIAGNOSIS — R030 Elevated blood-pressure reading, without diagnosis of hypertension: Secondary | ICD-10-CM

## 2019-12-17 MED ORDER — IBUPROFEN 800 MG PO TABS: 800.0000 mg | ORAL_TABLET | Freq: Three times a day (TID) | ORAL | 1 refills | Status: DC | PRN

## 2019-12-17 NOTE — Patient Instructions (Signed)
Tibial Fracture, Adult  A tibial fracture is a break in the larger bone in the lower leg (tibia). This bone is also called the shin bone. What are the causes? This condition is caused by an injury to the leg from:  A fall.  Sports.  A car accident. What are the signs or symptoms? The shape of your leg may not look normal. You may:  Have pain.  Have swelling.  Have bruising.  Be unable to walk.  Have numbness or a pins-and-needles feeling in the feet. How is this diagnosed? Your doctor will:  Check your symptoms and medical history.  Do a physical exam. You may also have other tests, including:  X-rays.  A CT scan.  MRI. How is this treated? Treatments may include:  Wearing a cast, splint, or brace until your bone has healed.  Using crutches as told by your doctor. This helps you not put weight on your leg.  Exercises (physical therapy). You will start after your cast, splint, or brace is removed. If the injury caused parts of the bone to move out of place, your doctor may:  Need to put the bones back before putting on the cast, splint, or brace.  Do surgery to put metal rods, plates, or screws into the bone to hold it in place. Follow these instructions at home: If you have a splint or brace:  Wear the splint or brace as told by your doctor. Remove it only as told by your doctor.  Loosen the splint or brace if your toes tingle, lose feeling (get numb), or turn cold and blue.  Keep the splint or brace clean and dry. If you have a cast:  Do not stick anything inside the cast to scratch your skin.  Check the skin around the cast every day. Tell your doctor about any concerns.  You may put lotion on dry skin around the edges of the cast.  Do not put lotion on the skin under the cast.  Keep the cast clean and dry. Bathing  Do not take baths, swim, or use a hot tub until your doctor says it is okay. Ask your doctor if you can take showers. You may only  be allowed to take sponge baths.  If the splint, brace, or cast is not waterproof. ? Do not let it get wet. ? Cover it with a watertight covering when you take a bath or a shower. Managing pain, stiffness, and swelling   If told, put ice on the injured area: ? If you have a removable splint, remove it as told by your doctor. ? Put ice in a plastic bag. ? Place a towel between your skin and the bag. ? Leave the ice on for 20 minutes, 2-3 times a day.  Move your toes often to avoid stiffness and to lessen swelling.  Raise (elevate) your lower leg above the level of your heart while you are sitting or lying down. Activity  Do not use your leg to support your body weight until your doctor says it is okay.  Use crutches as told by your doctor.  Ask your doctor what activities are safe for you during recovery.  Avoid activities as told by your doctor.  Do exercises as told by your doctor. Driving  Do not drive until your doctor says it is safe.  Do not drive or use heavy machinery while taking prescription pain medicine. Medicines  Ask your doctor if you should take supplements of calcium and  vitamin D to help your bones heal.  Take over-the-counter and prescription medicines only as told by your doctor.  To prevent or treat trouble pooping (constipation) while you are taking prescription pain medicine, your doctor may tell you to: ? Drink enough fluid to keep your pee (urine) pale yellow. ? Take medicines. ? Eat foods that are high in fiber, such as fresh fruits and vegetables, whole grains, and beans. ? Limit foods that are high in fat and sugars, such as fried or sweet foods. General instructions  Do not put pressure on any part of the cast or splint until it is fully hardened. This may take many hours.  Do not use any products that have nicotine or tobacco in them, such as cigarettes and e-cigarettes. These can delay bone healing. If you need help quitting, ask your  doctor.  Keep all follow-up visits as told by your doctor. This is important. Contact a doctor if you have:  Pain that gets worse or does not get better with medicine.  Redness or swelling that gets worse.  Numbness or tingling in your toes or foot. Get help right away if:  Your foot or toes get cold or turn blue, even after you loosen your splint.  You have very bad pain. Summary  A tibial fracture is a break in the larger bone in your lower leg.  You will need to wear a cast, splint, or brace until the bone is healed.  Use crutches as told by your doctor.  Ask your doctor what activities are safe for you during recovery. This information is not intended to replace advice given to you by your health care provider. Make sure you discuss any questions you have with your health care provider. Document Revised: 04/01/2017 Document Reviewed: 04/01/2017 Elsevier Patient Education  2020 [employer].

## 2019-12-17 NOTE — Progress Notes (Signed)
Established Patient Office Visit  Subjective:  Patient ID: Laurie Hull, female    DOB: 08/30/1965  Age: 54 y.o. MRN: [id number]  CC:  HPI  Ms. Laurie Hull,  54 y.o.female presents for follow up from the Urgent care on   12/10/19, patient was discharged  on 12/10/19, patient was evaluated for: Closed fracture of lateral portion of right tibial plateau.She is also establishing care. Her concern is who and when MRI is to be done.  Past Medical History:  Diagnosis Date  . Fibroids      Allergies  Allergen Reactions  . Oxycodone-Acetaminophen Itching and Rash      Current Outpatient Medications on File Prior to Visit  Medication Sig Dispense Refill  . HYDROcodone-acetaminophen (NORCO/VICODIN) 5-325 MG tablet Take 1-2 tablets by mouth every 6 (six) hours as needed for moderate pain or severe pain. 12 tablet 0   No current facility-administered medications on file prior to visit.    ROS: all negative except above.   Physical Exam: Filed Weights   12/17/19 1125  Weight: 293 lb 6.4 oz (133.1 kg)   BP (!) 148/85 (BP Location: Left Arm, Patient Position: Sitting, Cuff Size: Large)   Pulse 77   Temp 98.5 F (36.9 C) (Temporal)   Ht 5\' 8"  (1.727 m)   Wt 293 lb 6.4 oz (133.1 kg)   SpO2 100%   BMI 44.61 kg/m  General Appearance: Well nourished, in no apparent distress. Eyes: PERRLA, EOMs, conjunctiva no swelling or erythema Sinuses: No Frontal/maxillary tenderness ENT/Mouth: Ext aud canals clear, TMs without erythema, bulging. No erythema, swelling, or exudate on post pharynx.  Tonsils not swollen or erythematous. Hearing normal.  Neck: Supple, thyroid normal.  Respiratory: Respiratory effort normal, BS equal bilaterally without rales, rhonchi, wheezing or stridor.  Cardio: RRR with no MRGs. Brisk peripheral pulses without edema.  Abdomen: Soft, + BS.  Non tender, no guarding, rebound, hernias, masses. Lymphatics: Non tender without lymphadenopathy.  Musculoskeletal: Full  ROM, 5/5 strength, normal gait.  Skin: Warm, dry without rashes, lesions, ecchymosis.  Neuro: Cranial nerves intact. Normal muscle tone, no cerebellar symptoms. Sensation intact.  Psych: Awake and oriented X 3, normal affect, Insight and Judgment appropriate.    Health Maintenance Due  Topic Date Due  . Hepatitis C Screening  Never done  . HIV Screening  Never done  . COLONOSCOPY  Never done  . MAMMOGRAM  11/22/2016  . PAP SMEAR-Modifier  01/18/2018    There are no preventive care reminders to display for this patient.  Lab Results  Component Value Date   TSH 0.929 10/25/2014   Lab Results  Component Value Date   WBC 4.9 10/25/2014   HGB 12.3 10/25/2014   HCT 37.3 10/25/2014   MCV 93.7 10/25/2014   PLT 301 10/25/2014   Lab Results  Component Value Date   NA 139 09/22/2014   K 3.8 09/22/2014   CO2 26 09/22/2014   GLUCOSE 86 09/22/2014   BUN 9 09/22/2014   CREATININE 0.85 09/22/2014   BILITOT 0.4 09/22/2014   ALKPHOS 59 09/22/2014   AST 12 09/22/2014   ALT 10 09/22/2014   PROT 6.3 09/22/2014   ALBUMIN 3.8 09/22/2014   CALCIUM 8.9 09/22/2014    Assessment & Plan:  Laurie Hull was seen today for hospitalization follow-up.  Diagnoses and all orders for this visit:  Encounter to establish care Laurie Mire, NP-C will be your  (PCP) she is mastered prepared . Able to diagnosed and treatment also  answer health  concern as well as continuing care of varied medical conditions, not limited by cause, organ system, or diagnosis.   Hospital discharge follow-up Closed fracture of lateral portion of right tibial plateau, Effusion of right knee. She was placed in a  Knee immobilization, crutches, Norco/ibuprofen for severe pain, Tylenol/ibuprofen for mild to moderate pain.  Ice, elevate, follow-up with [facility] within 48 hours.  Work note for 2 days.     Elevated blood pressure reading without diagnosis of hypertension Counseled on blood pressure goal of less than  130/80, low-sodium, DASH diet, medication compliance, 150 minutes of moderate intensity exercise per week.  Morbidly obese (HCC) Morbid Obesity is > 40 indicating an excess in caloric intake or underlining conditions. This may lead to other co-morbidities.risk for HTN, DM and hypoventilation . Lifestyle modifications of diet and exercise may reduce obesity.   Closed fracture of lateral portion of right tibial plateau with routine healing, subsequent encounter -     MR Knee Right Wo Contrast; Future -     ibuprofen (ADVIL) 800 MG tablet; Take 1 tablet (800 mg total) by mouth every 8 (eight) hours as needed for moderate pain.    Meds ordered this encounter  Medications  . ibuprofen (ADVIL) 800 MG tablet    Sig: Take 1 tablet (800 mg total) by mouth every 8 (eight) hours as needed for moderate pain.    Dispense:  90 tablet    Refill:  1    Follow-up: Return for call for appoint BP f/u after seen by ortho.    Kerin Perna, NP

## 2019-12-18 ENCOUNTER — Encounter (INDEPENDENT_AMBULATORY_CARE_PROVIDER_SITE_OTHER): Payer: Self-pay | Admitting: Primary Care

## 2019-12-24 ENCOUNTER — Encounter (INDEPENDENT_AMBULATORY_CARE_PROVIDER_SITE_OTHER): Payer: Self-pay | Admitting: Primary Care

## 2019-12-25 ENCOUNTER — Encounter (INDEPENDENT_AMBULATORY_CARE_PROVIDER_SITE_OTHER): Payer: Self-pay | Admitting: Primary Care

## 2020-01-11 ENCOUNTER — Ambulatory Visit (HOSPITAL_COMMUNITY)
Admission: RE | Admit: 2020-01-11 | Discharge: 2020-01-11 | Disposition: A | Discharge status: Home / Self Care | Payer: 59 | Source: Ambulatory Visit | Attending: Primary Care | Admitting: Primary Care

## 2020-01-11 ENCOUNTER — Other Ambulatory Visit: Payer: Self-pay

## 2020-01-11 ENCOUNTER — Other Ambulatory Visit (INDEPENDENT_AMBULATORY_CARE_PROVIDER_SITE_OTHER): Payer: Self-pay | Admitting: Primary Care

## 2020-01-11 ENCOUNTER — Encounter (HOSPITAL_COMMUNITY): Payer: Self-pay | Admitting: *Deleted

## 2020-01-11 DIAGNOSIS — S82121D Displaced fracture of lateral condyle of right tibia, subsequent encounter for closed fracture with routine healing: Secondary | ICD-10-CM

## 2020-01-15 ENCOUNTER — Encounter: Payer: Self-pay | Admitting: Orthopaedic Surgery

## 2020-01-15 ENCOUNTER — Ambulatory Visit (INDEPENDENT_AMBULATORY_CARE_PROVIDER_SITE_OTHER): Payer: 59 | Admitting: Orthopaedic Surgery

## 2020-01-15 VITALS — Ht 68.0 in | Wt 293.0 lb

## 2020-01-15 DIAGNOSIS — S82141A Displaced bicondylar fracture of right tibia, initial encounter for closed fracture: Secondary | ICD-10-CM

## 2020-01-15 MED ORDER — HYDROCODONE-ACETAMINOPHEN 5-325 MG PO TABS: 1.0000 | ORAL_TABLET | Freq: Every day | ORAL | 0 refills | Status: DC | PRN

## 2020-01-15 NOTE — Progress Notes (Addendum)
Office Visit Note   Patient: Laurie Hull           Date of Birth: [birth year]           MRN: [id number] Visit Date: 01/15/2020              Requested by: Kerin Perna, NP [address] [place],  [place] [address] PCP: Kerin Perna, NP   Assessment & Plan: Visit Diagnoses:  1. Closed fracture of right tibial plateau, initial encounter     Plan: Impression is 4 weeks status post right lateral tibial plateau fracture.  This should be amenable to nonoperative treatment.  We have sent in a prescription for a [employer] lateral unloader brace.  Until she is provided with this brace, she will continue wearing her knee immobilizer using a crutch for protected weightbearing.  She will continue to ice and elevate for pain and swelling.  Have called in a small prescription of Norco to take at night.  We will provide her with a work note for desk work only for the next 2 weeks.  She will follow up with [place] at that point for repeat evaluation two-view x-rays of the right knee. She needs custom brace due to thigh to calf ratio.    Follow-Up Instructions: Return in about 2 weeks (around 01/29/2020).   Orders:  No orders of the defined types were placed in this encounter.  Meds ordered this encounter  Medications  . HYDROcodone-acetaminophen (NORCO) 5-325 MG tablet    Sig: Take 1-2 tablets by mouth daily as needed.    Dispense:  20 tablet    Refill:  0      Procedures: No procedures performed   Clinical Data: No additional findings.   Subjective: Chief Complaint  Patient presents with  . Right Knee - Pain    HPI patient is a very pleasant 54 year old female who comes in today following an injury to her right knee.  Approximately 1 month ago, she sustained a mechanical fall landing on the anterior aspect.  She was seen in urgent care where x-rays were obtained.  She was placed in a knee immobilizer and provided crutches.  Subsequent MRI was ordered which was not completed  until 01/11/2020.  MRI showed a depressed lateral tibial plateau fracture.  She comes in today for further evaluation treatment recommendation.  She notes that her pain has dramatically improved.  When she does get pain is to the anterolateral aspect of the knee.  She continues to ice and elevate this as well as take 800 mg of ibuprofen 1-2 times daily as needed.  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  Review of Systems as detailed in HPI.  All others reviewed and are negative.   Objective: Vital Signs: Ht 5\' 8"  (1.727 m)   Wt 293 lb (132.9 kg)   BMI 44.55 kg/m   Physical Exam well-developed well-nourished female no acute distress.  Alert oriented x3.  Ortho Exam right knee exam shows a moderate effusion.  Limited range of motion.  Moderate tenderness throughout both joint lines.  She is neurovascular intact distally.  Specialty Comments:  No specialty comments available.  Imaging: No new imaging   PMFS History: Patient Active Problem List   Diagnosis Date Noted  . GERD (gastroesophageal reflux disease) 08/10/2016  . Costochondritis 08/10/2016  . Obesity 10/21/2014  . CRUSHING INJURY OF FINGER 10/15/2008  . KNEE PAIN, LEFT 05/25/2008  . FIBROIDS, UTERUS 11/29/2006  Past Medical History:  Diagnosis Date  . Fibroids     Family History  Problem Relation Age of Onset  . Diabetes Mother   . Hypertension Mother   . Heart disease Father   . Hypertension Father     Past Surgical History:  Procedure Laterality Date  . HERNIA REPAIR    . MYOMECTOMY     Social History   Occupational History  . Not on file  Tobacco Use  . Smoking status: Never Smoker  . Smokeless tobacco: Never Used  . Tobacco comment: smoke weed   Vaping Use  . Vaping Use: Never used  Substance and Sexual Activity  . Alcohol use: Yes    Alcohol/week: 0.0 standard drinks    Comment: occ  . Drug use: Yes    Types: Marijuana  . Sexual activity: Not on file

## 2020-01-29 ENCOUNTER — Ambulatory Visit (INDEPENDENT_AMBULATORY_CARE_PROVIDER_SITE_OTHER): Payer: 59

## 2020-01-29 ENCOUNTER — Ambulatory Visit (INDEPENDENT_AMBULATORY_CARE_PROVIDER_SITE_OTHER): Payer: 59 | Admitting: Orthopaedic Surgery

## 2020-01-29 ENCOUNTER — Encounter: Payer: Self-pay | Admitting: Orthopaedic Surgery

## 2020-01-29 DIAGNOSIS — S82141A Displaced bicondylar fracture of right tibia, initial encounter for closed fracture: Secondary | ICD-10-CM

## 2020-01-29 MED ORDER — HYDROCODONE-ACETAMINOPHEN 5-325 MG PO TABS
1.0000 | ORAL_TABLET | Freq: Two times a day (BID) | ORAL | 0 refills | Status: DC | PRN
Start: 2020-01-29 — End: 2020-06-08

## 2020-01-29 NOTE — Progress Notes (Signed)
Office Visit Note   Patient: Laurie Hull           Date of Birth: [birth year]           MRN: [id number] Visit Date: 01/29/2020              Requested by: Kerin Perna, NP [address] [place],  [place] [address] PCP: Kerin Perna, NP   Assessment & Plan: Visit Diagnoses:  1. Closed fracture of right tibial plateau, initial encounter     Plan: Impression is 6 weeks status post right lateral tibial plateau fracture.  The patient is radiographically healing but clinically she is still hurting quite a bit.  We would still like for her to obtain the DJD lateral unloader brace.  She will need a custom brace due to her thigh to calf ratio.  She will follow up with [place] in 6 weeks time for repeat evaluation and 2 view x-rays of the right knee.  Work note provided to be out for the next 3 weeks.  Call with concerns or questions.  Follow-Up Instructions: Return in about 3 weeks (around 02/19/2020).   Orders:  Orders Placed This Encounter  Procedures  . XR Knee 1-2 Views Right   Meds ordered this encounter  Medications  . HYDROcodone-acetaminophen (NORCO) 5-325 MG tablet    Sig: Take 1-2 tablets by mouth 2 (two) times daily as needed.    Dispense:  30 tablet    Refill:  0      Procedures: No procedures performed   Clinical Data: No additional findings.   Subjective: Chief Complaint  Patient presents with  . Right Knee - Pain, Follow-up    HPI patient is a pleasant 54 year old female who comes in today 6 weeks out right lateral tibial plateau fracture.  She has been ambulating in a knee immobilizer.  She notes that she was fitted for the DJD unloader brace last Tuesday but has not yet received it.  She is having moderate pain with ambulation.  She has been taking Norco which does not seem to alleviate her symptoms.  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     Objective: Vital Signs: There were  no vitals taken for this visit.    Ortho Exam examination of the right knee reveals range of motion from 10 to 90 degrees.  Moderate tenderness along the lateral joint line.  She is neurovascular intact distally.  Specialty Comments:  No specialty comments available.  Imaging: XR Knee 1-2 Views Right  Result Date: 01/29/2020 X-rays reveal bony consolidation to the fracture lucency at lateral tibial plateau    PMFS History: Patient Active Problem List   Diagnosis Date Noted  . GERD (gastroesophageal reflux disease) 08/10/2016  . Costochondritis 08/10/2016  . Obesity 10/21/2014  . CRUSHING INJURY OF FINGER 10/15/2008  . KNEE PAIN, LEFT 05/25/2008  . FIBROIDS, UTERUS 11/29/2006   Past Medical History:  Diagnosis Date  . Fibroids     Family History  Problem Relation Age of Onset  . Diabetes Mother   . Hypertension Mother   . Heart disease Father   . Hypertension Father     Past Surgical History:  Procedure Laterality Date  . HERNIA REPAIR    . MYOMECTOMY     Social History   Occupational History  . Not on file  Tobacco Use  . Smoking status: Never Smoker  . Smokeless tobacco:  Never Used  . Tobacco comment: smoke weed   Vaping Use  . Vaping Use: Never used  Substance and Sexual Activity  . Alcohol use: Yes    Alcohol/week: 0.0 standard drinks    Comment: occ  . Drug use: Yes    Types: Marijuana  . Sexual activity: Not on file

## 2020-02-18 ENCOUNTER — Ambulatory Visit (INDEPENDENT_AMBULATORY_CARE_PROVIDER_SITE_OTHER): Payer: 59 | Admitting: Orthopaedic Surgery

## 2020-02-18 ENCOUNTER — Ambulatory Visit (INDEPENDENT_AMBULATORY_CARE_PROVIDER_SITE_OTHER): Payer: 59

## 2020-02-18 ENCOUNTER — Encounter: Payer: Self-pay | Admitting: Orthopaedic Surgery

## 2020-02-18 DIAGNOSIS — S82141A Displaced bicondylar fracture of right tibia, initial encounter for closed fracture: Secondary | ICD-10-CM

## 2020-02-18 NOTE — Progress Notes (Signed)
   Office Visit Note   Patient: Laurie Hull           Date of Birth: 10/10/1965           MRN: [id number] Visit Date: 02/18/2020              Requested by: Kerin Perna, NP [address] [place],  [place] [address] PCP: Kerin Perna, NP   Assessment & Plan: Visit Diagnoses:  1. Closed fracture of right tibial plateau, initial encounter     Plan: Impression is 9 weeks status post right lateral tibial plateau fracture with continued evidence of healing.  She will start to wear her unloader brace today when ambulating.  She may take this off when she is sitting or lying down.  We will keep her out of work for 1 more week and then she may return to work working third shift only as she states this is much less strenuous.  She will follow up with [place] in 3 weeks time for repeat evaluation and 2 view x-rays of the right knee.  Follow-Up Instructions: Return if symptoms worsen or fail to improve.   Orders:  Orders Placed This Encounter  Procedures  . XR Knee 1-2 Views Right   No orders of the defined types were placed in this encounter.     Procedures: No procedures performed   Clinical Data: No additional findings.   Subjective: Chief Complaint  Patient presents with  . Right Knee - Pain, Follow-up    HPI patient is a very pleasant 54 year old female who comes in today approximately 9 weeks out right lateral tibial plateau fracture.  She is starting to feel much better, she was recently fitted for her lateral unloader brace and just received this today.  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     Objective: Vital Signs: There were no vitals taken for this visit.    Ortho Exam right knee exam shows moderate tenderness to the lateral joint line.  She has increased pain with flexion.  She is neurovascular intact distally.  Specialty Comments:  No specialty comments available.  Imaging: XR Knee 1-2 Views  Right  Result Date: 02/18/2020 X-rays demonstrate bony consolidation to the fracture site.    PMFS History: Patient Active Problem List   Diagnosis Date Noted  . GERD (gastroesophageal reflux disease) 08/10/2016  . Costochondritis 08/10/2016  . Obesity 10/21/2014  . CRUSHING INJURY OF FINGER 10/15/2008  . KNEE PAIN, LEFT 05/25/2008  . FIBROIDS, UTERUS 11/29/2006   Past Medical History:  Diagnosis Date  . Fibroids     Family History  Problem Relation Age of Onset  . Diabetes Mother   . Hypertension Mother   . Heart disease Father   . Hypertension Father     Past Surgical History:  Procedure Laterality Date  . HERNIA REPAIR    . MYOMECTOMY     Social History   Occupational History  . Not on file  Tobacco Use  . Smoking status: Never Smoker  . Smokeless tobacco: Never Used  . Tobacco comment: smoke weed   Vaping Use  . Vaping Use: Never used  Substance and Sexual Activity  . Alcohol use: Yes    Alcohol/week: 0.0 standard drinks    Comment: occ  . Drug use: Yes    Types: Marijuana  . Sexual activity: Not on file

## 2020-03-10 ENCOUNTER — Ambulatory Visit: Payer: 59 | Admitting: Orthopaedic Surgery

## 2020-03-16 ENCOUNTER — Encounter: Payer: Self-pay | Admitting: Orthopaedic Surgery

## 2020-03-16 ENCOUNTER — Ambulatory Visit (INDEPENDENT_AMBULATORY_CARE_PROVIDER_SITE_OTHER): Payer: 59 | Admitting: Orthopaedic Surgery

## 2020-03-16 ENCOUNTER — Other Ambulatory Visit: Payer: Self-pay

## 2020-03-16 ENCOUNTER — Ambulatory Visit (INDEPENDENT_AMBULATORY_CARE_PROVIDER_SITE_OTHER): Payer: 59

## 2020-03-16 VITALS — Ht 68.0 in | Wt 293.0 lb

## 2020-03-16 DIAGNOSIS — S82141A Displaced bicondylar fracture of right tibia, initial encounter for closed fracture: Secondary | ICD-10-CM | POA: Diagnosis not present

## 2020-03-16 NOTE — Progress Notes (Signed)
   Post-Op Visit Note   Patient: Laurie Hull           Date of Birth: December 14, 1965           MRN: [id number] Visit Date: 03/16/2020 PCP: Kerin Perna, NP   Assessment & Plan:  Chief Complaint:  Chief Complaint  Patient presents with  . Right Knee - Follow-up   Visit Diagnoses:  1. Closed fracture of right tibial plateau, initial encounter     Plan:   [place] is 3 months status post a nondisplaced lateral tibial plateau fracture.  Overall doing well.  No real complaints other than some mild morning stiffness and that resolves fairly quickly.  Physical examination of the right knee is pretty much benign.  No tenderness or limitation range of motion.  Collaterals and cruciates are stable.  X-rays demonstrate a healed fracture without any complications.  At this point she can gradually increase activity as tolerated.  Wean brace as needed.  Follow-up as needed.  Follow-Up Instructions: Return if symptoms worsen or fail to improve.   Orders:  Orders Placed This Encounter  Procedures  . XR Knee 1-2 Views Right   No orders of the defined types were placed in this encounter.   Imaging: XR Knee 1-2 Views Right  Result Date: 03/16/2020 Healed lateral tibial plateau fracture.   PMFS History: Patient Active Problem List   Diagnosis Date Noted  . GERD (gastroesophageal reflux disease) 08/10/2016  . Costochondritis 08/10/2016  . Obesity 10/21/2014  . CRUSHING INJURY OF FINGER 10/15/2008  . KNEE PAIN, LEFT 05/25/2008  . FIBROIDS, UTERUS 11/29/2006   Past Medical History:  Diagnosis Date  . Fibroids     Family History  Problem Relation Age of Onset  . Diabetes Mother   . Hypertension Mother   . Heart disease Father   . Hypertension Father     Past Surgical History:  Procedure Laterality Date  . HERNIA REPAIR    . MYOMECTOMY     Social History   Occupational History  . Not on file  Tobacco Use  . Smoking status: Never Smoker  . Smokeless tobacco:  Never Used  . Tobacco comment: smoke weed   Vaping Use  . Vaping Use: Never used  Substance and Sexual Activity  . Alcohol use: Yes    Alcohol/week: 0.0 standard drinks    Comment: occ  . Drug use: Yes    Types: Marijuana  . Sexual activity: Not on file

## 2020-06-08 ENCOUNTER — Ambulatory Visit (INDEPENDENT_AMBULATORY_CARE_PROVIDER_SITE_OTHER): Payer: 59 | Admitting: Primary Care

## 2020-06-08 ENCOUNTER — Encounter (INDEPENDENT_AMBULATORY_CARE_PROVIDER_SITE_OTHER): Payer: Self-pay | Admitting: Primary Care

## 2020-06-08 ENCOUNTER — Other Ambulatory Visit: Payer: Self-pay

## 2020-06-08 VITALS — BP 112/76 | HR 88 | Temp 97.3°F | Ht 68.0 in | Wt 295.2 lb

## 2020-06-08 DIAGNOSIS — M25561 Pain in right knee: Secondary | ICD-10-CM

## 2020-06-08 DIAGNOSIS — Z23 Encounter for immunization: Secondary | ICD-10-CM

## 2020-06-08 DIAGNOSIS — M25562 Pain in left knee: Secondary | ICD-10-CM

## 2020-06-08 DIAGNOSIS — M545 Low back pain, unspecified: Secondary | ICD-10-CM

## 2020-06-08 DIAGNOSIS — G8929 Other chronic pain: Secondary | ICD-10-CM

## 2020-06-08 MED ORDER — IBUPROFEN 800 MG PO TABS: 800.0000 mg | ORAL_TABLET | Freq: Three times a day (TID) | ORAL | 1 refills | Status: DC | PRN

## 2020-06-08 NOTE — Progress Notes (Signed)
Pain on right side and hot flashes  Was told that right side pain is from sciatic nerve- but pain has been real intense

## 2020-06-08 NOTE — Patient Instructions (Addendum)
. https://www.womenshealth.gov/menopause/menopause-basics"> https://www.clinicalkey.com">  Menopause Menopause is the normal time of a woman's life when menstrual periods stop completely. It marks the natural end to a woman's ability to become pregnant. It can be defined as the absence of a menstrual period for 12 months without another medical cause. The transition to menopause (perimenopause) most often happens between the ages of 37 and 59, and can last for many years. During perimenopause, hormone levels change in your body, which can cause symptoms and affect your health. Menopause may increase your risk for:  Weakened bones (osteoporosis), which causes fractures.  Depression.  Hardening and narrowing of the arteries (atherosclerosis), which can cause heart attacks and strokes. What are the causes? This condition is usually caused by a natural change in hormone levels that happens as you get older. The condition may also be caused by changes that are not natural, including:  Surgery to remove both ovaries (surgical menopause).  Side effects from some medicines, such as chemotherapy used to treat cancer (chemical menopause). What increases the risk? This condition is more likely to start at an earlier age if you have certain medical conditions or have undergone treatments, including:  A tumor of the pituitary gland in the brain.  A disease that affects the ovaries and hormones.  Certain cancer treatments, such as chemotherapy or hormone therapy, or radiation therapy on the pelvis.  Heavy smoking and excessive alcohol use.  Family history of early menopause. This condition is also more likely to develop earlier in women who are very thin. What are the signs or symptoms? Symptoms of this condition include:  Hot flashes.  Irregular menstrual periods.  Night sweats.  Changes in feelings about sex. This could be a decrease in sex drive or an increased discomfort around your  sexuality.  Vaginal dryness and thinning of the vaginal walls. This may cause painful sex.  Dryness of the skin and development of wrinkles.  Headaches.  Problems sleeping (insomnia).  Mood swings or irritability.  Memory problems.  Weight gain.  Hair growth on the face and chest.  Bladder infections or problems with urinating. How is this diagnosed? This condition is diagnosed based on your medical history, a physical exam, your age, your menstrual history, and your symptoms. Hormone tests may also be done. How is this treated? In some cases, no treatment is needed. You and your health care provider should make a decision together about whether treatment is necessary. Treatment will be based on your individual condition and preferences. Treatment for this condition focuses on managing symptoms. Treatment may include:  Menopausal hormone therapy (MHT).  Medicines to treat specific symptoms or complications.  Acupuncture.  Vitamin or herbal supplements. Before starting treatment, make sure to let your health care provider know if you have a personal or family history of these conditions:  Heart disease.  Breast cancer.  Blood clots.  Diabetes.  Osteoporosis. Follow these instructions at home: Lifestyle  Do not use any products that contain nicotine or tobacco, such as cigarettes, e-cigarettes, and chewing tobacco. If you need help quitting, ask your health care provider.  Get at least 30 minutes of physical activity on 5 or more days each week.  Avoid alcoholic and caffeinated beverages, as well as spicy foods. This may help prevent hot flashes.  Get 7-8 hours of sleep each night.  If you have hot flashes, try: ? Dressing in layers. ? Avoiding things that may trigger hot flashes, such as spicy food, warm places, or stress. ? Taking slow,  deep breaths when a hot flash starts. ? Keeping a fan in your home and office.  Find ways to manage stress, such as deep  breathing, meditation, or journaling.  Consider going to group therapy with other women who are having menopause symptoms. Ask your health care provider about recommended group therapy meetings. Eating and drinking  Eat a healthy, balanced diet that contains whole grains, lean protein, low-fat dairy, and plenty of fruits and vegetables.  Your health care provider may recommend adding more soy to your diet. Foods that contain soy include tofu, tempeh, and soy milk.  Eat plenty of foods that contain calcium and vitamin D for bone health. Items that are rich in calcium include low-fat milk, yogurt, beans, almonds, sardines, broccoli, and kale.   Medicines  Take over-the-counter and prescription medicines only as told by your health care provider.  Talk with your health care provider before starting any herbal supplements. If prescribed, take vitamins and supplements as told by your health care provider. General instructions  Keep track of your menstrual periods, including: ? When they occur. ? How heavy they are and how long they last. ? How much time passes between periods.  Keep track of your symptoms, noting when they start, how often you have them, and how long they last.  Use vaginal lubricants or moisturizers to help with vaginal dryness and improve comfort during sex.  Keep all follow-up visits. This is important. This includes any group therapy or counseling.   Contact a health care provider if:  You are still having menstrual periods after age 1.  You have pain during sex.  You have not had a period for 12 months and you develop vaginal bleeding. Get help right away if you have:  Severe depression.  Excessive vaginal bleeding.  Pain when you urinate.  A fast or irregular heartbeat (palpitations).  Severe headaches.  Abdominal pain or severe indigestion. Summary  Menopause is a normal time of life when menstrual periods stop completely. It is usually defined as  the absence of a menstrual period for 12 months without another medical cause.  The transition to menopause (perimenopause) most often happens between the ages of 10 and 59 and can last for several years.  Symptoms can be managed through medicines, lifestyle changes, and complementary therapies such as acupuncture.  Eat a balanced diet that is rich in nutrients to promote bone health and heart health and to manage symptoms during menopause. This information is not intended to replace advice given to you by your health care provider. Make sure you discuss any questions you have with your health care provider. Document Revised: 12/18/2019 Document Reviewed: 09/03/2019 Elsevier Patient Education  [place] Use a dropper to put olive oil or canola oil in the effected ear- 2-3 times a week. Let it soak for 20-30 min then you can take a shower or use a baby bulb with warm water to wash out the ear wax.  Do not use Qtips

## 2020-06-08 NOTE — Progress Notes (Signed)
Established Patient Office Visit  Subjective:  Patient ID: Laurie Hull, female    DOB: October 14, 1965  Age: 55 y.o. MRN: [id number]  CC:  Chief Complaint  Patient presents with  . Pain    On right side   . Weight Loss    HPI Laurie Hull 55 year old morbid obese female who presents for pain bilateral knees, 8/10- worst - weather and standing for periods of time - makes it better rest - tylenol arthritis. Hot flashes explained she is in menopause.  She also has right-sided pain in her lower back and has a history of sciatic nerve pain.  Past Medical History:  Diagnosis Date  . Fibroids     Past Surgical History:  Procedure Laterality Date  . HERNIA REPAIR    . MYOMECTOMY      Family History  Problem Relation Age of Onset  . Diabetes Mother   . Hypertension Mother   . Heart disease Father   . Hypertension Father     Social History   Socioeconomic History  . Marital status: Single    Spouse name: Not on file  . Number of children: Not on file  . Years of education: Not on file  . Highest education level: Not on file  Occupational History  . Not on file  Tobacco Use  . Smoking status: Never Smoker  . Smokeless tobacco: Never Used  . Tobacco comment: smoke weed   Vaping Use  . Vaping Use: Never used  Substance and Sexual Activity  . Alcohol use: Yes    Alcohol/week: 0.0 standard drinks    Comment: occ  . Drug use: Yes    Types: Marijuana  . Sexual activity: Not on file  Other Topics Concern  . Not on file  Social History Narrative  . Not on file   Social Determinants of Health   Financial Resource Strain: Not on file  Food Insecurity: Not on file  Transportation Needs: Not on file  Physical Activity: Not on file  Stress: Not on file  Social Connections: Not on file  Intimate Partner Violence: Not on file    Outpatient Medications Prior to Visit  Medication Sig Dispense Refill  . HYDROcodone-acetaminophen (NORCO) 5-325 MG tablet Take 1-2  tablets by mouth 2 (two) times daily as needed. 30 tablet 0  . ibuprofen (ADVIL) 800 MG tablet Take 1 tablet (800 mg total) by mouth every 8 (eight) hours as needed for moderate pain. 90 tablet 1   No facility-administered medications prior to visit.    Allergies  Allergen Reactions  . Oxycodone-Acetaminophen Itching and Rash    ROS Review of Systems  Genitourinary:       Menopausal with hot flashes  Musculoskeletal:       Bilateral knee pain Right side back pain  All other systems reviewed and are negative.     Objective:    Physical Exam  BP 112/76 (BP Location: Right Arm, Patient Position: Sitting, Cuff Size: Large)   Pulse 88   Temp (!) 97.3 F (36.3 C) (Temporal)   Ht 5\' 8"  (1.727 m)   Wt 295 lb 3.2 oz (133.9 kg)   SpO2 97%   BMI 44.89 kg/m  Wt Readings from Last 3 Encounters:  06/08/20 295 lb 3.2 oz (133.9 kg)  03/16/20 293 lb (132.9 kg)  01/15/20 293 lb (132.9 kg)   General: Vital signs reviewed.  Patient is well-developed and well-nourished, morbid obese female in no acute distress and cooperative with  exam Head: Normocephalic and atraumatic. Eyes: EOMI, conjunctivae normal, no scleral icterus.  Neck: Supple, trachea midline, normal ROM, no JVD, masses, thyromegaly, or carotid bruit present.  Cardiovascular: RRR, S1 normal, S2 normal, no murmurs, gallops, or rubs. Pulmonary/Chest: Clear to auscultation bilaterally, no wheezes, rales, or rhonchi. Abdominal: Soft, non-tender, non-distended, BS +, no masses, organomegaly, or guarding present.  Musculoskeletal: No joint deformities, erythema, or stiffness in knees getting up and down . Extremities: No lower extremity edema bilaterally,  pulses symmetric and intact bilaterally. No cyanosis or clubbing. Neurological: A&O x3, Strength is normal and symmetric bilaterally, cranial nerve II-XII are grossly intact, no focal motor deficit, sensory intact to light touch bilaterally.  Skin: Warm, dry and intact. No rashes  or erythema. Psychiatric: Normal mood and affect. speech and behavior is normal. Cognition and memory are normal.   Health Maintenance Due  Topic Date Due  . Hepatitis C Screening  Never done  . HIV Screening  Never done  . COLONOSCOPY (Pts 45-47yrs Insurance coverage will need to be confirmed)  Never done  . MAMMOGRAM  11/22/2016  . PAP SMEAR-Modifier  01/18/2018  . COVID-19 Vaccine (3 - Booster for Pfizer series) 06/07/2020    There are no preventive care reminders to display for this patient.  Lab Results  Component Value Date   TSH 0.929 10/25/2014   Lab Results  Component Value Date   WBC 4.9 10/25/2014   HGB 12.3 10/25/2014   HCT 37.3 10/25/2014   MCV 93.7 10/25/2014   PLT 301 10/25/2014   Lab Results  Component Value Date   NA 139 09/22/2014   K 3.8 09/22/2014   CO2 26 09/22/2014   GLUCOSE 86 09/22/2014   BUN 9 09/22/2014   CREATININE 0.85 09/22/2014   BILITOT 0.4 09/22/2014   ALKPHOS 59 09/22/2014   AST 12 09/22/2014   ALT 10 09/22/2014   PROT 6.3 09/22/2014   ALBUMIN 3.8 09/22/2014   CALCIUM 8.9 09/22/2014   No results found for: CHOL No results found for: HDL No results found for: LDLCALC No results found for: TRIG No results found for: CHOLHDL No results found for: HGBA1C    Assessment & Plan:  Laurie Hull was seen today for pain and weight loss.  Diagnoses and all orders for this visit:  Chronic pain of both knees Work on losing weight to help reduce joint pain. May alternate with heat and ice application for pain relief. May also alternate with acetaminophen and Ibuprofen 800mg   as prescribed pain relief. Other alternatives include massage, acupuncture and water aerobics.  You must stay active and avoid a sedentary lifestyle. -     ibuprofen (ADVIL) 800 MG tablet; Take 1 tablet (800 mg total) by mouth every 8 (eight) hours as needed for moderate pain.  Need for immunization against influenza -     Flu Vaccine QUAD 36+ mos IM  Chronic right-sided  low back pain, unspecified whether sciatica pres. ent Previously told pain from sciatica raise leg no pain radiating from back.  There is no problems with bowel or bladder.  She has been prescribed 800 mg of ibuprofen for her knees which also can help with the pain in her back.  She is also interested in losing weight which will help with both knees and back.  Discussed decreasing carbohydrates and exercising as tolerated     Follow-up: Return in about 3 months (around 09/08/2020) for weight loss.    Kerin Perna, NP

## 2020-06-14 ENCOUNTER — Ambulatory Visit (INDEPENDENT_AMBULATORY_CARE_PROVIDER_SITE_OTHER): Payer: 59

## 2020-06-14 ENCOUNTER — Other Ambulatory Visit: Payer: Self-pay

## 2020-06-14 DIAGNOSIS — Z111 Encounter for screening for respiratory tuberculosis: Secondary | ICD-10-CM

## 2020-06-16 ENCOUNTER — Other Ambulatory Visit: Payer: Self-pay

## 2020-06-16 ENCOUNTER — Ambulatory Visit (INDEPENDENT_AMBULATORY_CARE_PROVIDER_SITE_OTHER): Payer: 59

## 2020-06-16 ENCOUNTER — Encounter (INDEPENDENT_AMBULATORY_CARE_PROVIDER_SITE_OTHER): Payer: Self-pay

## 2020-06-16 DIAGNOSIS — Z111 Encounter for screening for respiratory tuberculosis: Secondary | ICD-10-CM

## 2020-06-16 LAB — TB SKIN TEST
Induration: 0 mm
TB Skin Test: NEGATIVE

## 2020-07-04 ENCOUNTER — Encounter (INDEPENDENT_AMBULATORY_CARE_PROVIDER_SITE_OTHER): Payer: Self-pay | Admitting: Primary Care

## 2020-07-04 DIAGNOSIS — Z1211 Encounter for screening for malignant neoplasm of colon: Secondary | ICD-10-CM

## 2020-07-04 DIAGNOSIS — Z1231 Encounter for screening mammogram for malignant neoplasm of breast: Secondary | ICD-10-CM

## 2020-08-02 ENCOUNTER — Other Ambulatory Visit: Payer: Self-pay | Admitting: Primary Care

## 2020-08-02 DIAGNOSIS — Z1231 Encounter for screening mammogram for malignant neoplasm of breast: Secondary | ICD-10-CM

## 2020-09-11 ENCOUNTER — Other Ambulatory Visit (INDEPENDENT_AMBULATORY_CARE_PROVIDER_SITE_OTHER): Payer: Self-pay | Admitting: Primary Care

## 2020-09-11 DIAGNOSIS — G8929 Other chronic pain: Secondary | ICD-10-CM

## 2020-09-11 NOTE — Telephone Encounter (Signed)
Requested medication (s) are due for refill today: yes  Requested medication (s) are on the active medication list: yes  Last refill:  06/08/20  Future visit scheduled: yes  Notes to clinic:  overdue lab work   Requested Prescriptions  Pending Prescriptions Disp Refills   ibuprofen (ADVIL) 800 MG tablet [Pharmacy Med Name: IBUPROFEN 800MG  TABLETS] 90 tablet 1    Sig: TAKE 1 TABLET(800 MG) BY MOUTH EVERY 8 HOURS AS NEEDED FOR MODERATE PAIN      Analgesics:  NSAIDS Failed - 09/11/2020  3:24 AM      Failed - Cr in normal range and within 360 days    Creat  Date Value Ref Range Status  09/22/2014 0.85 0.50 - 1.10 mg/dL Final          Failed - HGB in normal range and within 360 days    Hemoglobin  Date Value Ref Range Status  10/25/2014 12.3 12.0 - 15.0 g/dL Final          Passed - Patient is not pregnant      Passed - Valid encounter within last 12 months    Recent Outpatient Visits           3 months ago Chronic pain of both knees   [place], Michelle P, NP   8 months ago Encounter to establish care   [place], Michelle P, NP   3 years ago Acute eye pain   [place], FNP   4 years ago Screening for metabolic disorder   [place], [place], MD   5 years ago Uterine leiomyoma, unspecified location   [place], Valerie A, NP

## 2020-09-23 ENCOUNTER — Other Ambulatory Visit: Payer: Self-pay

## 2020-09-23 ENCOUNTER — Ambulatory Visit
Admission: RE | Admit: 2020-09-23 | Discharge: 2020-09-23 | Disposition: A | Discharge status: Home / Self Care | Payer: 59 | Source: Ambulatory Visit | Attending: Primary Care | Admitting: Primary Care

## 2020-09-23 DIAGNOSIS — Z1231 Encounter for screening mammogram for malignant neoplasm of breast: Secondary | ICD-10-CM

## 2020-12-29 ENCOUNTER — Encounter: Payer: Self-pay | Admitting: Gastroenterology

## 2021-02-15 ENCOUNTER — Other Ambulatory Visit: Payer: Self-pay

## 2021-02-15 ENCOUNTER — Ambulatory Visit (AMBULATORY_SURGERY_CENTER): Payer: 59 | Admitting: *Deleted

## 2021-02-15 ENCOUNTER — Encounter (INDEPENDENT_AMBULATORY_CARE_PROVIDER_SITE_OTHER): Payer: Self-pay | Admitting: Primary Care

## 2021-02-15 VITALS — Ht 68.0 in | Wt 240.0 lb

## 2021-02-15 DIAGNOSIS — G8929 Other chronic pain: Secondary | ICD-10-CM

## 2021-02-15 DIAGNOSIS — Z1211 Encounter for screening for malignant neoplasm of colon: Secondary | ICD-10-CM

## 2021-02-15 MED ORDER — PEG 3350-KCL-NA BICARB-NACL 420 G PO SOLR: 4000.0000 mL | Freq: Once | ORAL | 0 refills | Status: AC

## 2021-02-15 NOTE — Progress Notes (Signed)

## 2021-02-16 MED ORDER — IBUPROFEN 800 MG PO TABS: 800.0000 mg | ORAL_TABLET | Freq: Three times a day (TID) | ORAL | 0 refills | Status: DC | PRN

## 2021-03-03 ENCOUNTER — Encounter: Payer: Self-pay | Admitting: Gastroenterology

## 2021-03-03 ENCOUNTER — Ambulatory Visit (AMBULATORY_SURGERY_CENTER): Payer: 59 | Admitting: Gastroenterology

## 2021-03-03 ENCOUNTER — Other Ambulatory Visit: Payer: Self-pay

## 2021-03-03 VITALS — BP 151/85 | HR 64 | Temp 97.2°F | Resp 14 | Ht 68.0 in | Wt 220.0 lb

## 2021-03-03 DIAGNOSIS — K635 Polyp of colon: Secondary | ICD-10-CM

## 2021-03-03 DIAGNOSIS — D123 Benign neoplasm of transverse colon: Secondary | ICD-10-CM

## 2021-03-03 DIAGNOSIS — Z1211 Encounter for screening for malignant neoplasm of colon: Secondary | ICD-10-CM

## 2021-03-03 DIAGNOSIS — D127 Benign neoplasm of rectosigmoid junction: Secondary | ICD-10-CM

## 2021-03-03 MED ORDER — SODIUM CHLORIDE 0.9 % IV SOLN: 500.0000 mL | Freq: Once | INTRAVENOUS | Status: DC

## 2021-03-03 NOTE — Op Note (Signed)
[facility] Patient Name: Laurie Hull Procedure Date: 03/03/2021 11:15 AM MRN: [id number] Endoscopist: Remo Lipps P. Havery Moros , MD Age: 55 Referring MD:  Date of Birth: 12-10-1965 Gender: Female Account #: [id number] Procedure:                Colonoscopy Indications:              Screening for colorectal malignant neoplasm, This                            is the patient's first colonoscopy Medicines:                Monitored Anesthesia Care Procedure:                Pre-Anesthesia Assessment:                           - Prior to the procedure, a History and Physical                            was performed, and patient medications and                            allergies were reviewed. The patient's tolerance of                            previous anesthesia was also reviewed. The risks                            and benefits of the procedure and the sedation                            options and risks were discussed with the patient.                            All questions were answered, and informed consent                            was obtained. Prior Anticoagulants: The patient has                            taken no previous anticoagulant or antiplatelet                            agents. ASA Grade Assessment: II - A patient with                            mild systemic disease. After reviewing the risks                            and benefits, the patient was deemed in                            satisfactory condition to undergo the procedure.  After obtaining informed consent, the colonoscope                            was passed under direct vision. Throughout the                            procedure, the patient's blood pressure, pulse, and                            oxygen saturations were monitored continuously. The                            Olympus #[id number] was introduced through the anus                            and advanced to  the the cecum, identified by                            appendiceal orifice and ileocecal valve. The                            colonoscopy was performed without difficulty. The                            patient tolerated the procedure well. The quality                            of the bowel preparation was good. The ileocecal                            valve, appendiceal orifice, and rectum were                            photographed. Scope In: 11:21:01 AM Scope Out: 11:34:32 AM Scope Withdrawal Time: 0 hours 11 minutes 46 seconds  Total Procedure Duration: 0 hours 13 minutes 31 seconds  Findings:                 The perianal and digital rectal examinations were                            normal.                           There was a medium-sized lipoma, in the ascending                            colon.                           A 4 mm polyp was found in the transverse colon. The                            polyp was sessile. The polyp was removed with a  cold snare. Resection and retrieval were complete.                           Multiple benign hyperplastic left sided polyps                            noted. A representative 3 mm sessile rectosigmoid                            polyp was removed with a cold snare. Resection and                            retrieval were complete.                           Internal hemorrhoids were found during                            retroflexion. The hemorrhoids were small.                           Two small cecal AVMs noted, as well as benign                            superficial erythema of the rectum, the latter                            likely due to bowel prep artifact. The exam was                            otherwise without abnormality. Complications:            No immediate complications. Estimated blood loss:                            Minimal. Estimated Blood Loss:     Estimated blood loss was  minimal. Impression:               - Medium-sized lipoma in the ascending colon.                           - One 4 mm polyp in the transverse colon, removed                            with a cold snare. Resected and retrieved.                           - Suspect benign left sided hyperplastic polyps,                            representative lesion removed.                           - Two small cecal AVMs                           -  Benign limited superficial erythema of the rectum                            likely bowel prep artifact                           - Internal hemorrhoids.                           - The examination was otherwise normal. Recommendation:           - Patient has a contact number available for                            emergencies. The signs and symptoms of potential                            delayed complications were discussed with the                            patient. Return to normal activities tomorrow.                            Written discharge instructions were provided to the                            patient.                           - Resume previous diet.                           - Continue present medications.                           - Await pathology results. Remo Lipps P. Nickolette Espinola, MD 03/03/2021 11:41:14 AM This report has been signed electronically.

## 2021-03-03 NOTE — Progress Notes (Signed)
A and O x3. Report to RN. Tolerated MAC anesthesia well. 

## 2021-03-03 NOTE — Progress Notes (Signed)
[facility] History and Physical   Primary Care Physician:  Kerin Perna, NP   Reason for Procedure:   Colon cancer screening  Plan:    colonoscopy     HPI: Laurie Hull is a 55 y.o. female  here for colonoscopy screening - first time exam. Patient denies any bowel symptoms at this time. No family history of colon cancer known. Otherwise feels well without any cardiopulmonary symptoms.    Past Medical History:  Diagnosis Date   Allergy    SEASONAL   Bell's palsy    EARLY 2000'S   Fibroids    History of anesthesia complications    HARD TO WAKE    Past Surgical History:  Procedure Laterality Date   HERNIA REPAIR     KNEE SURGERY Left    MYOMECTOMY     WISDOM TOOTH EXTRACTION      Prior to Admission medications   Medication Sig Start Date End Date Taking? Authorizing Provider  ibuprofen (ADVIL) 800 MG tablet Take 1 tablet (800 mg total) by mouth every 8 (eight) hours as needed for moderate pain. 02/16/21  Yes Kerin Perna, NP    Current Outpatient Medications  Medication Sig Dispense Refill   ibuprofen (ADVIL) 800 MG tablet Take 1 tablet (800 mg total) by mouth every 8 (eight) hours as needed for moderate pain. 90 tablet 0   Current Facility-Administered Medications  Medication Dose Route Frequency Provider Last Rate Last Admin   0.9 %  sodium chloride infusion  500 mL Intravenous Once Gerry Blanchfield, Carlota Raspberry, MD        Allergies as of 03/03/2021 - Review Complete 03/03/2021  Allergen Reaction Noted   Oxycodone-acetaminophen Itching and Rash 06/08/2008    Family History  Problem Relation Age of Onset   Diabetes Mother    Hypertension Mother    Heart disease Father    Hypertension Father    Colon polyps Neg Hx    Colon cancer Neg Hx    Esophageal cancer Neg Hx    Rectal cancer Neg Hx    Stomach cancer Neg Hx     Social History   Socioeconomic History   Marital status: Single    Spouse name: Not on file   Number of children:  Not on file   Years of education: Not on file   Highest education level: Not on file  Occupational History   Not on file  Tobacco Use   Smoking status: Never   Smokeless tobacco: Never   Tobacco comments:    smoke weed   Vaping Use   Vaping Use: Never used  Substance and Sexual Activity   Alcohol use: Yes    Alcohol/week: 5.0 standard drinks    Types: 5 Glasses of wine per week    Comment: occ   Drug use: Yes    Types: Marijuana   Sexual activity: Yes    Comment: States no possibility of pregnancy/ approx period 2 months ago/states menopoausal  Other Topics Concern   Not on file  Social History Narrative   Not on file   Social Determinants of Health   Financial Resource Strain: Not on file  Food Insecurity: Not on file  Transportation Needs: Not on file  Physical Activity: Not on file  Stress: Not on file  Social Connections: Not on file  Intimate Partner Violence: Not on file    Review of Systems: All other review of systems negative except as mentioned in the HPI.  Physical Exam: Vital signs  BP 129/75 (BP Location: Right Arm, Patient Position: Sitting, Cuff Size: Normal)   Pulse 90   Temp (!) 97.2 F (36.2 C) (Temporal)   Ht 5\' 8"  (1.727 m)   Wt 220 lb (99.8 kg)   SpO2 99%   BMI 33.45 kg/m   General:   Alert,  Well-developed, pleasant and cooperative in NAD Lungs:  Clear throughout to auscultation.   Heart:  Regular rate and rhythm Abdomen:  Soft, nontender and nondistended.   Neuro/Psych:  Alert and cooperative. Normal mood and affect. A and O x 3  Jolly Mango, MD [facility]

## 2021-03-03 NOTE — Progress Notes (Signed)
Vitals-CW  Pt's states no medical or surgical changes since previsit or office visit. 

## 2021-03-03 NOTE — Progress Notes (Signed)
Called to room to assist during endoscopic procedure.  Patient ID and intended procedure confirmed with present staff. Received instructions for my participation in the procedure from the performing physician.  

## 2021-03-03 NOTE — Patient Instructions (Signed)
YOU HAD AN ENDOSCOPIC PROCEDURE TODAY AT THE [facility]:   Refer to the procedure report that was given to you for any specific questions about what was found during the examination.  If the procedure report does not answer your questions, please call your gastroenterologist to clarify.  If you requested that your care partner not be given the details of your procedure findings, then the procedure report has been included in a sealed envelope for you to review at your convenience later.  YOU SHOULD EXPECT: Some feelings of bloating in the abdomen. Passage of more gas than usual.  Walking can help get rid of the air that was put into your GI tract during the procedure and reduce the bloating. If you had a lower endoscopy (such as a colonoscopy or flexible sigmoidoscopy) you may notice spotting of blood in your stool or on the toilet paper. If you underwent a bowel prep for your procedure, you may not have a normal bowel movement for a few days.  Please Note:  You might notice some irritation and congestion in your nose or some drainage.  This is from the oxygen used during your procedure.  There is no need for concern and it should clear up in a day or so.  SYMPTOMS TO REPORT IMMEDIATELY:   Following lower endoscopy (colonoscopy or flexible sigmoidoscopy):  Excessive amounts of blood in the stool  Significant tenderness or worsening of abdominal pains  Swelling of the abdomen that is new, acute  Fever of 100F or higher  For urgent or emergent issues, a gastroenterologist can be reached at any hour by calling [phone]. Do not use MyChart messaging for urgent concerns.    DIET:  We do recommend a small meal at first, but then you may proceed to your regular diet.  Drink plenty of fluids but you should avoid alcoholic beverages for 24 hours.  ACTIVITY:  You should plan to take it easy for the rest of today and you should NOT DRIVE or use heavy machinery until tomorrow (because  of the sedation medicines used during the test).    FOLLOW UP: Our staff will call the number listed on your records 48-72 hours following your procedure to check on you and address any questions or concerns that you may have regarding the information given to you following your procedure. If we do not reach you, we will leave a message.  We will attempt to reach you two times.  During this call, we will ask if you have developed any symptoms of COVID 19. If you develop any symptoms (ie: fever, flu-like symptoms, shortness of breath, cough etc.) before then, please call [phone].  If you test positive for Covid 19 in the 2 weeks post procedure, please call and report this information to us.    If any biopsies were taken you will be contacted by phone or by letter within the next 1-3 weeks.  Please call us at [phone] if you have not heard about the biopsies in 3 weeks.    SIGNATURES/CONFIDENTIALITY: You and/or your care partner have signed paperwork which will be entered into your electronic medical record.  These signatures attest to the fact that that the information above on your After Visit Summary has been reviewed and is understood.  Full responsibility of the confidentiality of this discharge information lies with you and/or your care-partner. 

## 2021-03-07 ENCOUNTER — Telehealth: Payer: Self-pay

## 2021-03-07 NOTE — Telephone Encounter (Signed)
Attempted to reach patient for post-procedure f/u call. No answer. Left message that staff will make another attempt to reach her later today and for her to please not hesitate to call us if she has any questions/concerns regarding her care.

## 2021-05-03 ENCOUNTER — Encounter (INDEPENDENT_AMBULATORY_CARE_PROVIDER_SITE_OTHER): Payer: Self-pay | Admitting: Primary Care

## 2021-06-14 ENCOUNTER — Ambulatory Visit (INDEPENDENT_AMBULATORY_CARE_PROVIDER_SITE_OTHER): Payer: 59 | Admitting: Primary Care

## 2021-06-14 ENCOUNTER — Encounter (INDEPENDENT_AMBULATORY_CARE_PROVIDER_SITE_OTHER): Payer: Self-pay | Admitting: Primary Care

## 2021-06-14 ENCOUNTER — Other Ambulatory Visit: Payer: Self-pay

## 2021-06-14 VITALS — BP 134/72 | HR 84 | Temp 97.7°F | Ht 68.0 in | Wt 291.4 lb

## 2021-06-14 DIAGNOSIS — R051 Acute cough: Secondary | ICD-10-CM

## 2021-06-14 DIAGNOSIS — G8929 Other chronic pain: Secondary | ICD-10-CM

## 2021-06-14 DIAGNOSIS — M25561 Pain in right knee: Secondary | ICD-10-CM

## 2021-06-14 DIAGNOSIS — Z111 Encounter for screening for respiratory tuberculosis: Secondary | ICD-10-CM | POA: Diagnosis not present

## 2021-06-14 DIAGNOSIS — Z13220 Encounter for screening for lipoid disorders: Secondary | ICD-10-CM

## 2021-06-14 DIAGNOSIS — M25562 Pain in left knee: Secondary | ICD-10-CM | POA: Diagnosis not present

## 2021-06-14 DIAGNOSIS — M545 Low back pain, unspecified: Secondary | ICD-10-CM | POA: Diagnosis not present

## 2021-06-14 DIAGNOSIS — Z6841 Body Mass Index (BMI) 40.0 and over, adult: Secondary | ICD-10-CM

## 2021-06-14 MED ORDER — BENZONATATE 100 MG PO CAPS: 100.0000 mg | ORAL_CAPSULE | Freq: Two times a day (BID) | ORAL | 0 refills | Status: DC | PRN

## 2021-06-14 MED ORDER — IBUPROFEN 800 MG PO TABS: 800.0000 mg | ORAL_TABLET | Freq: Three times a day (TID) | ORAL | 0 refills | Status: DC | PRN

## 2021-06-14 NOTE — Progress Notes (Signed)
?[place] ? ?Laurie Hull, is a 56 y.o. female ? ?XBJ:[id number] ? ?HYQ:[id number] ? ?DOB - 03/12/1966 ? ?Chief Complaint  ?Patient presents with  ? ppd placement for employer  ?    ? ?Subjective:  ? ?Laurie Hull is a 56 y.o. female here today for ppd placement per employer. She continues to have low back pain due to week knees and a knee injury (right)  Patient has No headache, No chest pain, No abdominal pain - No Nausea, No new weakness tingling or numbness, No Cough - shortness of breath. ?No problems updated. ? ?Allergies  ?Allergen Reactions  ? Oxycodone-Acetaminophen Itching and Rash  ? ? ?Past Medical History:  ?Diagnosis Date  ? Allergy   ? SEASONAL  ? Bell's palsy   ? EARLY 2000'S  ? Fibroids   ? History of anesthesia complications   ? HARD TO WAKE  ? ? ?No current outpatient medications on file prior to visit.  ? ?No current facility-administered medications on file prior to visit.  ? ? ?Objective:  ? ?Vitals:  ? 06/14/21 1022  ?BP: 134/72  ?Pulse: 84  ?Temp: 97.7 ?F (36.5 ?C)  ?TempSrc: Oral  ?SpO2: 97%  ?Weight: 291 lb 6.4 oz (132.2 kg)  ?Height: _0  (1.727 m)  ? ? ?Exam ?General appearance : Awake, alert, not in any distress. Speech Clear. Not toxic looking ?HEENT: Atraumatic and Normocephalic, pupils equally reactive to light and accomodation ?Neck: Supple, no JVD. No cervical lymphadenopathy.  ?Chest: Good air entry bilaterally, no added sounds  ?CVS: S1 S2 regular, no murmurs.  ?Abdomen: Bowel sounds present, Non tender and not distended with no gaurding, rigidity or rebound. ?Extremities: B/L Lower Ext shows no edema, both legs are warm to touch ?Neurology: Awake alert, and oriented X 3,  Non focal ?Skin: No Rash ? ?Data Review ?No results found for: HGBA1C ? ?Assessment & Plan  ?[place] was seen today for ppd placement for employer. ? ?Diagnoses and all orders for this visit: ? ?Chronic right-sided low back pain, unspecified whether sciatica present ? low back pain due  to week knees and a knee injury (right) ? ?Chronic pain of both knees ?Work on losing weight to help reduce joint pain. May alternate with heat and ice application for pain relief. May also alternate with acetaminophen and Ibuprofen as prescribed pain relief. Other alternatives include massage, acupuncture and water aerobics.  You must stay active and avoid a sedentary lifestyle.  ?-     CMP14+EGFR ?-     ibuprofen (ADVIL) 800 MG tablet; Take 1 tablet (800 mg total) by mouth every 8 (eight) hours as needed for moderate pain. ? ?Morbidly obese ([place]) ?Discussed diet and exercise for person with BMI >25. Instructed: You must burn more calories than you eat. Losing 5 percent of your body weight should be considered a success. In the longer term, losing more than 15 percent of your body weight and staying at this weight is an extremely good result. However, keep in mind that even losing 5 percent of your body weight leads to important health benefits, so try not to get discouraged if you're not able to lose more than this. ?Will recheck weight in 3-6 months.  ?-     CBC with Differential ? ?Lipid screening ?-     Lipid Panel ? ?Acute cough ?-     benzonatate (TESSALON) 100 MG capsule; Take 1 capsule (100 mg total) by mouth 2 (two) times daily as needed for cough. ? ?Tuberculin  skin test encounter ?-     PPD ? ?Patient have been counseled extensively about nutrition and exercise. Other issues discussed during this visit include: low cholesterol diet, weight control and daily exercise, foot care, annual eye examinations at Ophthalmology, importance of adherence with medications and regular follow-up. We also discussed long term complications of uncontrolled diabetes and hypertension.  ? ?Return for pap. ? ?The patient was given clear instructions to go to ER or return to medical center if symptoms don't improve, worsen or new problems develop. The patient verbalized understanding. The patient was told to call to get lab  results if they haven't heard anything in the next week.  ? ?This note has been created with Surveyor, quantity. Any transcriptional errors are unintentional.  ? ?Kerin Perna, NP ?06/19/2021, 2:16 PM  ?

## 2021-06-14 NOTE — Progress Notes (Signed)
Pt is fasting  ?Would like refill of ibuprofen due to back from lifting for work. Pt is a CNA  ?

## 2021-06-15 LAB — CMP14+EGFR
ALT: 13 IU/L (ref 0–32)
AST: 15 IU/L (ref 0–40)
Albumin/Globulin Ratio: 1.6 (ref 1.2–2.2)
Albumin: 4.5 g/dL (ref 3.8–4.9)
Alkaline Phosphatase: 107 IU/L (ref 44–121)
BUN/Creatinine Ratio: 14 (ref 9–23)
BUN: 12 mg/dL (ref 6–24)
Bilirubin Total: 0.4 mg/dL (ref 0.0–1.2)
CO2: 28 mmol/L (ref 20–29)
Calcium: 10 mg/dL (ref 8.7–10.2)
Chloride: 104 mmol/L (ref 96–106)
Creatinine, Ser: 0.84 mg/dL (ref 0.57–1.00)
Globulin, Total: 2.8 g/dL (ref 1.5–4.5)
Glucose: 102 mg/dL — ABNORMAL HIGH (ref 70–99)
Potassium: 4.3 mmol/L (ref 3.5–5.2)
Sodium: 142 mmol/L (ref 134–144)
Total Protein: 7.3 g/dL (ref 6.0–8.5)
eGFR: 82 mL/min/{1.73_m2} (ref >59)

## 2021-06-15 LAB — CBC WITH DIFFERENTIAL/PLATELET
Basophils Absolute: 0 10*3/uL (ref 0.0–0.2)
Basos: 1 %
EOS (ABSOLUTE): 0.1 10*3/uL (ref 0.0–0.4)
Eos: 2 %
Hematocrit: 40.5 % (ref 34.0–46.6)
Hemoglobin: 13.2 g/dL (ref 11.1–15.9)
Immature Grans (Abs): 0 10*3/uL (ref 0.0–0.1)
Immature Granulocytes: 0 %
Lymphocytes Absolute: 1.9 10*3/uL (ref 0.7–3.1)
Lymphs: 46 %
MCH: 29.5 pg (ref 26.6–33.0)
MCHC: 32.6 g/dL (ref 31.5–35.7)
MCV: 90 fL (ref 79–97)
Monocytes Absolute: 0.3 10*3/uL (ref 0.1–0.9)
Monocytes: 7 %
Neutrophils Absolute: 1.8 10*3/uL (ref 1.4–7.0)
Neutrophils: 44 %
Platelets: 348 10*3/uL (ref 150–450)
RBC: 4.48 x10E6/uL (ref 3.77–5.28)
RDW: 12.5 % (ref 11.7–15.4)
WBC: 4.1 10*3/uL (ref 3.4–10.8)

## 2021-06-15 LAB — LIPID PANEL
Chol/HDL Ratio: 2.8 ratio (ref 0.0–4.4)
Cholesterol, Total: 196 mg/dL (ref 100–199)
HDL: 69 mg/dL (ref >39)
LDL Chol Calc (NIH): 115 mg/dL — ABNORMAL HIGH (ref 0–99)
Triglycerides: 64 mg/dL (ref 0–149)
VLDL Cholesterol Cal: 12 mg/dL (ref 5–40)

## 2021-06-16 ENCOUNTER — Other Ambulatory Visit: Payer: Self-pay

## 2021-06-16 ENCOUNTER — Ambulatory Visit (INDEPENDENT_AMBULATORY_CARE_PROVIDER_SITE_OTHER): Payer: 59

## 2021-06-16 DIAGNOSIS — Z111 Encounter for screening for respiratory tuberculosis: Secondary | ICD-10-CM

## 2021-06-16 LAB — TB SKIN TEST
Induration: 0 mm
TB Skin Test: NEGATIVE

## 2021-07-05 ENCOUNTER — Ambulatory Visit (INDEPENDENT_AMBULATORY_CARE_PROVIDER_SITE_OTHER): Payer: 59 | Admitting: Primary Care

## 2021-07-06 ENCOUNTER — Other Ambulatory Visit (HOSPITAL_COMMUNITY)
Admission: RE | Admit: 2021-07-06 | Discharge: 2021-07-06 | Disposition: A | Discharge status: Home / Self Care | Payer: 59 | Source: Ambulatory Visit | Attending: Primary Care | Admitting: Primary Care

## 2021-07-06 ENCOUNTER — Ambulatory Visit (INDEPENDENT_AMBULATORY_CARE_PROVIDER_SITE_OTHER): Payer: 59 | Admitting: Primary Care

## 2021-07-06 ENCOUNTER — Encounter (INDEPENDENT_AMBULATORY_CARE_PROVIDER_SITE_OTHER): Payer: Self-pay | Admitting: Primary Care

## 2021-07-06 VITALS — BP 129/85 | HR 84 | Temp 98.0°F | Ht 68.0 in | Wt 292.0 lb

## 2021-07-06 DIAGNOSIS — Z124 Encounter for screening for malignant neoplasm of cervix: Secondary | ICD-10-CM | POA: Insufficient documentation

## 2021-07-06 DIAGNOSIS — D229 Melanocytic nevi, unspecified: Secondary | ICD-10-CM

## 2021-07-06 DIAGNOSIS — Z113 Encounter for screening for infections with a predominantly sexual mode of transmission: Secondary | ICD-10-CM

## 2021-07-06 NOTE — Progress Notes (Signed)
?  [facility] ? ?WELL-WOMAN PHYSICAL & PAP ?Patient name: Laurie Hull MRN [id number]  Date of birth: 1966/02/21 ?Chief Complaint:   ?Gynecologic Exam ? ?History of Present Illness:   ?Laurie Hull is a 56 y.o. No obstetric history on file. female being seen today for a routine well-woman exam.  ? ?CC:'@visit'$  info@ ? ? ?The current method of family planning is none.  ?No LMP recorded. Patient is perimenopausal. ?Last pap 01/18/18.  ?Last mammogram: 6/22. Results were: normal. Family h/o breast cancer: No ?Last colonoscopy: 07/15/15 .  Family h/o colorectal cancer: No ? ?Review of Systems:   ? ?Denies any headaches, blurred vision, fatigue, shortness of breath, chest pain, abdominal pain, abnormal vaginal discharge/itching/odor/irritation, problems with periods, bowel movements, urination, or intercourse unless otherwise stated above. ? ?Pertinent History Reviewed:  ? ?Reviewed past medical,surgical, social and family history.  ?Reviewed problem list, medications and allergies. ? ?Physical Assessment:  ? ?Vitals:  ? 07/06/21 1614  ?BP: 129/85  ?Pulse: 84  ?Temp: 98 ?F (36.7 ?C)  ?TempSrc: Oral  ?SpO2: 96%  ?Weight: 292 lb (132.5 kg)  ?Height: '5\' 8"'$  (1.727 m)  ?Body mass index is 44.4 kg/m?. ?  ?     Physical Examination:  ?General appearance - well appearing, and in no distress ?Mental status - alert, oriented to person, place, and time ?Psych:  She has a normal mood and affect ?Skin - warm and dry, normal color, no suspicious lesions noted ?Chest - effort normal, all lung fields clear to auscultation bilaterally ?Heart - normal rate and regular rhythm ?Neck:  midline trachea, no thyromegaly or nodules ?Breasts - breasts appear normal, no suspicious masses, no skin or nipple changes or axillary nodes ?Educated patient on proper self breast examination and had patient to demonstrate SBE. ?Abdomen - soft, nontender, nondistended, no masses or organomegaly ?Pelvic-VULVA: normal appearing vulva with no  masses, tenderness or lesions   ?VAGINA: normal appearing vagina with normal color and discharge, no lesions   ?CERVIX: normal appearing cervix without discharge or lesions, no CMT ?UTERUS: uterus is felt to be normal size, shape, consistency and nontender  ?ADNEXA: No adnexal masses or tenderness noted. ?Extremities:  No swelling or varicosities noted ? ?No results found for this or any previous visit (from the past 24 hour(s)).  ? ?Assessment & Plan:  ? ?Zayla was seen today for gynecologic exam. ? ?Diagnoses and all orders for this visit: ? ?Cervical cancer screening ?-     Cytology - PAP([place]) ? ?Screening examination for STD (sexually transmitted disease) ?-     Cervicovaginal ancillary only ? ?Skin mole ? ? ? ? ?-     Ambulatory referral to Dermatology ? ?  ? ?Meds: No orders of the defined types were placed in this encounter. ? ? ?Follow-up: Return if symptoms worsen or fail to improve. ? ?This note has been created with Surveyor, quantity. Any transcriptional errors are unintentional.  ? ?Kerin Perna, NP ?07/11/2021, 1:57 PM  ?

## 2021-07-06 NOTE — Patient Instructions (Signed)
Pap Test ?Why am I having this test? ?A Pap test, also called a Pap smear, is a screening test to check for signs of: ?Infection. ?Cancer of the cervix. The cervix is the lower part of the uterus that opens into the vagina. ?Changes that may be a sign that cancer is developing (precancerous changes). ?Women need this test on a regular basis. In general, you should have a Pap test every 3 years until you reach menopause or age 56. Women aged 30-60 may choose to have their Pap test done at the same time as an HPV (human papillomavirus) test every 5 years (instead of every 3 years). ?Your health care provider may recommend having Pap tests more or less often depending on your medical conditions and past Pap test results. ?What is being tested? ?Cervical cells are tested for signs of infection or abnormalities. ?What kind of sample is taken? ?Your health care provider will collect a sample of cells from the surface of your cervix. This will be done using a small cotton swab, plastic spatula, or brush that is inserted into your vagina using a tool called a speculum. This sample is often collected during a pelvic exam, when you are lying on your back on an exam table with your feet in footrests (stirrups). In some cases, fluids (secretions) from the cervix or vagina may also be collected. ?How do I prepare for this test? ?Be aware of where you are in your menstrual cycle. If you are menstruating on the day of the test, you may be asked to reschedule. ?You may need to reschedule if you have a known vaginal infection on the day of the test. ?Follow instructions from your health care provider about: ?Changing or stopping your regular medicines. Some medicines can cause abnormal test results, such as vaginal medicines and tetracycline. ?Avoiding douching 2-3 days before or the day of the test. ?Tell a health care provider about: ?Any allergies you have. ?All medicines you are taking, including vitamins, herbs, eye drops,  creams, and over-the-counter medicines. ?Any bleeding problems you have. ?Any surgeries you have had. ?Any medical conditions you have. ?Whether you are pregnant or may be pregnant. ?How are the results reported? ?Your test results will be reported as either abnormal or normal. ?What do the results mean? ?A normal test result means that you do not have signs of cancer of the cervix. ?An abnormal result may mean that you have: ?Cancer. A Pap test by itself is not enough to diagnose cancer. You will have more tests done if cancer is suspected. ?Precancerous changes in your cervix. ?Inflammation of the cervix. ?An STI (sexually transmitted infection). ?A fungal infection. ?A parasite infection. ?Talk with your health care provider about what your results mean. In some cases, your health care provider may do more testing to confirm the results. ?Questions to ask your health care provider ?Ask your health care provider, or the department that is doing the test: ?When will my results be ready? ?How will I get my results? ?What are my treatment options? ?What other tests do I need? ?What are my next steps? ?Summary ?In general, women should have a Pap test every 3 years until they reach menopause or age 56. ?Your health care provider will collect a sample of cells from the surface of your cervix. This will be done using a small cotton swab, plastic spatula, or brush. ?In some cases, fluids (secretions) from the cervix or vagina may also be collected. ?This information is not intended  to replace advice given to you by your health care provider. Make sure you discuss any questions you have with your health care provider. ?Document Revised: 06/17/2020 Document Reviewed: 06/17/2020 ?Elsevier Patient Education ? [employer]. ? ?

## 2021-07-07 LAB — CERVICOVAGINAL ANCILLARY ONLY
Bacterial Vaginitis (gardnerella): POSITIVE — AB
Candida Glabrata: NEGATIVE
Candida Vaginitis: NEGATIVE
Chlamydia: NEGATIVE
Comment: NEGATIVE
Comment: NEGATIVE
Comment: NEGATIVE
Comment: NEGATIVE
Comment: NEGATIVE
Comment: NORMAL
Neisseria Gonorrhea: NEGATIVE
Trichomonas: NEGATIVE

## 2021-07-10 ENCOUNTER — Other Ambulatory Visit (INDEPENDENT_AMBULATORY_CARE_PROVIDER_SITE_OTHER): Payer: Self-pay | Admitting: Primary Care

## 2021-07-10 DIAGNOSIS — B9689 Other specified bacterial agents as the cause of diseases classified elsewhere: Secondary | ICD-10-CM

## 2021-07-10 LAB — CYTOLOGY - PAP
Adequacy: ABSENT
Diagnosis: NEGATIVE

## 2021-07-10 MED ORDER — METRONIDAZOLE 500 MG PO TABS: 500.0000 mg | ORAL_TABLET | Freq: Two times a day (BID) | ORAL | 0 refills | Status: DC

## 2021-08-03 ENCOUNTER — Encounter (INDEPENDENT_AMBULATORY_CARE_PROVIDER_SITE_OTHER): Payer: Self-pay | Admitting: Primary Care

## 2021-08-03 ENCOUNTER — Ambulatory Visit (INDEPENDENT_AMBULATORY_CARE_PROVIDER_SITE_OTHER): Payer: Self-pay

## 2021-08-03 ENCOUNTER — Ambulatory Visit (INDEPENDENT_AMBULATORY_CARE_PROVIDER_SITE_OTHER): Payer: 59 | Admitting: Primary Care

## 2021-08-03 VITALS — BP 126/92 | HR 104 | Temp 97.8°F | Resp 18 | Ht 68.0 in | Wt 286.0 lb

## 2021-08-03 DIAGNOSIS — M25562 Pain in left knee: Secondary | ICD-10-CM | POA: Diagnosis not present

## 2021-08-03 NOTE — Telephone Encounter (Signed)
?  Chief Complaint: Knee swelling ?Symptoms: L knee swelling like a "pumpkin", pain 12/10, heat and redness ?Frequency: 1 week ?Pertinent Negatives: pt is unable to apply weight on LLE ?Disposition: '[]'$ ED /'[]'$ Urgent Care (no appt availability in office) / '[x]'$ Appointment(In office/virtual)/ '[]'$  [facility]/ '[]'$ Home Care/ '[]'$ Refused Recommended Disposition /'[]'$ [facility]/ '[]'$  Follow-up with PCP ?Additional Notes: pt states she thinks she blew her knee out, worked 32 hours in 2 days and swelling hasn't improved and now having to walk on crutches from old incident. Appt scheduled for 1510 today.  ? ?Reason for Disposition ? [1] SEVERE pain (e.g., excruciating, unable to walk) AND [2] not improved after 2 hours of pain medicine ? ?Answer Assessment - Initial Assessment Questions ?1. LOCATION: "Where is the swelling located?"  (e.g., left, right, both knees) ?    L knee  ?2. SIZE and DESCRIPTION: "What does the swelling look like?"  (e.g., entire knee, localized) ?    Swelling like a pumpkin  ?3. ONSET: "When did the swelling start?" "Does it come and go, or is it there all the time?" ?    1 week  ?4. PAIN: "Is there any pain?" If Yes, ask: "How bad is it?" (Scale 1-10; or mild, moderate, severe) ?    12/10 ?5. SETTING: "Has there been any recent work, exercise or other activity that involved that part of the body?"  ?    Worked 32 hrs in 2 days  ?6. AGGRAVATING FACTORS: "What makes the knee swelling worse?" (e.g., walking, climbing stairs, running) ?    Unable to walk on LLE  ?8. OTHER SYMPTOMS: "Do you have any other symptoms?" (e.g., chest pain, difficulty breathing, fever, calf pain) ?    Slight redness ? ?Protocols used: Knee Swelling-A-AH ? ?

## 2021-08-03 NOTE — Progress Notes (Signed)
? ? ? ? ?  Acute Office Visit ? ?Subjective:  ? ?  ?Patient ID: Laurie Hull, female    DOB: November 10, 1965, 56 y.o.   MRN: [id number] ? ?Chief Complaint  ?Patient presents with  ? Edema  ?  Of left knee since last tuesday  ? ? ?HPI ?Laurie Hull is a 56 year old female in today for left knee pain and swelling since 07/25/21. Laurie Hull has been trying to keep her leg elevated and taking Tylenol with no relief. Reached out to ortho and spoke with Laurie Hull - Laurie Hull was able to view pictures and schedule her a work in at [facility] tomorrow at 9 am. Pain 10/10 and aggravating factors standing, walking , bending and steps.  Patient has No headache, No chest pain, No abdominal pain - No Nausea, No new weakness tingling or numbness, No Cough - shortness of breath ? ?ROS ?Comprehensive ROS Pertinent positive and negative noted in HPI   ? ?   ?Objective:  ? BP (!) 126/92 (BP Location: Left Leg, Patient Position: Sitting, Cuff Size: Large)   Pulse (!) 104   Temp 97.8 ?F (36.6 ?C) (Oral)   Resp 18   Ht '5\' 8"'$  (1.727 m)   Wt 286 lb (129.7 kg)   SpO2 100%   BMI 43.49 kg/m?   ? ?Physical Exam ?Vitals reviewed.  ?Constitutional:   ?   Appearance: Laurie Hull is obese.  ?HENT:  ?   Head: Normocephalic.  ?   Right Ear: External ear normal.  ?   Left Ear: External ear normal.  ?   Nose: Nose normal.  ?Eyes:  ?   Extraocular Movements: Extraocular movements intact.  ?Cardiovascular:  ?   Rate and Rhythm: Normal rate and regular rhythm.  ?Pulmonary:  ?   Effort: Pulmonary effort is normal.  ?   Breath sounds: Normal breath sounds.  ?Abdominal:  ?   General: Bowel sounds are normal. There is distension.  ?   Palpations: Abdomen is soft.  ?Musculoskeletal:  ?   Cervical back: Normal range of motion and neck supple.  ?   Comments: Decrease ROM left knee swollen  ?Skin: ?   General: Skin is warm and dry.  ?Neurological:  ?   Mental Status: Laurie Hull is oriented to person, place, and time.  ?Psychiatric:     ?   Mood and Affect: Mood normal.     ?   Behavior:  Behavior normal.     ?   Thought Content: Thought content normal.     ?   Judgment: Judgment normal.  ? ?No results found for any visits on 08/03/21. ? ?   ?Assessment & Plan:  ?[place] was seen today for edema. ? ?Diagnoses and all orders for this visit: ? ?Left knee pain, unspecified chronicity ? ? ? ? ?Schedule appt with [facility] at 9 am as a work in  ? ? ?Kerin Perna, NP ? ? ?

## 2021-08-04 ENCOUNTER — Ambulatory Visit: Payer: 59 | Admitting: Orthopaedic Surgery

## 2021-08-04 ENCOUNTER — Ambulatory Visit (INDEPENDENT_AMBULATORY_CARE_PROVIDER_SITE_OTHER): Payer: 59

## 2021-08-04 ENCOUNTER — Encounter: Payer: Self-pay | Admitting: Orthopaedic Surgery

## 2021-08-04 VITALS — Ht 68.0 in | Wt 286.0 lb

## 2021-08-04 DIAGNOSIS — M25562 Pain in left knee: Secondary | ICD-10-CM

## 2021-08-04 MED ORDER — TRAMADOL HCL 50 MG PO TABS: 50.0000 mg | ORAL_TABLET | Freq: Three times a day (TID) | ORAL | 2 refills | Status: DC | PRN

## 2021-08-04 MED ORDER — BUPIVACAINE HCL 0.5 % IJ SOLN
2.0000 mL | INTRAMUSCULAR | Status: AC | PRN
  Administered 2021-08-04: 2 mL via INTRA_ARTICULAR

## 2021-08-04 MED ORDER — LIDOCAINE HCL 1 % IJ SOLN
2.0000 mL | INTRAMUSCULAR | Status: AC | PRN
  Administered 2021-08-04: 2 mL

## 2021-08-04 NOTE — Progress Notes (Signed)
? ?Office Visit Note ?  ?Patient: Laurie Hull           ?Date of Birth: May 12, 1965           ?MRN: [id number] ?Visit Date: 08/04/2021 ?             ?Requested by: Kerin Perna, NP ?[id number] ?[place],  [place] [id number] ?PCP: Kerin Perna, NP ? ? ?Assessment & Plan: ?Visit Diagnoses:  ?1. Acute pain of left knee   ? ? ?Plan: Impression is acute left knee pain with effusion.  It is hard to tell whether this is aggravation of her underlying arthritis versus gout versus possible infection.  I would like to aspirate this today and send the fluid off for cell count, culture and crystals.  We will be in touch with her over the phone.  70 cc aspirated today.  I have called in tramadol to take in the meantime.  Call with any concerns or questions. ? ?Follow-Up Instructions: Return if symptoms worsen or fail to improve.  ? ?Orders:  ?Orders Placed This Encounter  ?Procedures  ? Anaerobic and Aerobic Culture  ? Gram stain  ? XR KNEE 3 VIEW LEFT  ? Synovial Fluid Analysis, Complete  ? ?Meds ordered this encounter  ?Medications  ? traMADol (ULTRAM) 50 MG tablet  ?  Sig: Take 1 tablet (50 mg total) by mouth 3 (three) times daily as needed.  ?  Dispense:  30 tablet  ?  Refill:  2  ? ? ? ? Procedures: ?Large Joint Inj: L knee on 08/04/2021 2:27 PM ?Details: 22 G needle ?Medications: 2 mL bupivacaine 0.5 %; 2 mL lidocaine 1 % ?Outcome: tolerated well, no immediate complications ?Patient was prepped and draped in the usual sterile fashion.  ? ? ? ? ?Clinical Data: ?No additional findings. ? ? ?Subjective: ?Chief Complaint  ?Patient presents with  ? Left Knee - Pain  ? ? ?HPI patient is a pleasant 56 year old female who comes in today with left knee pain for the past week.  She woke up 1 morning with significant pain in inability to bear weight to the left lower extremity.  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  Majority of her pain is the lateral aspect  with associated swelling.  Pain is worse with ambulation.  She has been using ice and elevating her leg as well as taking NSAIDs and Tylenol without relief.  She denies any fevers or chills.  She does have a history of remote left knee arthroscopy. ? ?Review of Systems as detailed in HPI.  All others reviewed and are negative. ? ? ?Objective: ?Vital Signs: Ht '5\' 8"'$  (1.727 m)   Wt 286 lb (129.7 kg)   BMI 43.49 kg/m?  ? ?Physical Exam well-developed well-nourished female no acute distress.  Alert and oriented x3. ? ?Ortho Exam left knee exam shows a moderate effusion.  Range of motion 10 to 90 degrees.  No warmth or erythema.  She has moderate tenderness to the medial knee.  She is neurovascular intact distally. ? ?Specialty Comments:  ?No specialty comments available. ? ?Imaging: ?XR KNEE 3 VIEW LEFT ? ?Result Date: 08/04/2021 ?X-rays demonstrate moderate medial and patellofemoral degenerative changes  ? ? ?PMFS History: ?Patient Active Problem List  ? Diagnosis Date Noted  ? GERD (gastroesophageal reflux disease) 08/10/2016  ? Costochondritis 08/10/2016  ? Obesity 10/21/2014  ? CRUSHING INJURY OF FINGER 10/15/2008  ?  KNEE PAIN, LEFT 05/25/2008  ? FIBROIDS, UTERUS 11/29/2006  ? ?Past Medical History:  ?Diagnosis Date  ? Allergy   ? SEASONAL  ? Bell's palsy   ? EARLY 2000'S  ? Fibroids   ? History of anesthesia complications   ? HARD TO WAKE  ?  ?Family History  ?Problem Relation Age of Onset  ? Diabetes Mother   ? Hypertension Mother   ? Heart disease Father   ? Hypertension Father   ? Colon polyps Neg Hx   ? Colon cancer Neg Hx   ? Esophageal cancer Neg Hx   ? Rectal cancer Neg Hx   ? Stomach cancer Neg Hx   ?  ?Past Surgical History:  ?Procedure Laterality Date  ? HERNIA REPAIR    ? KNEE SURGERY Left   ? MYOMECTOMY    ? WISDOM TOOTH EXTRACTION    ? ?Social History  ? ?Occupational History  ? Not on file  ?Tobacco Use  ? Smoking status: Never  ? Smokeless tobacco: Never  ? Tobacco comments:  ?  smoke weed   ?Vaping  Use  ? Vaping Use: Never used  ?Substance and Sexual Activity  ? Alcohol use: Yes  ?  Alcohol/week: 5.0 standard drinks  ?  Types: 5 Glasses of wine per week  ?  Comment: occ  ? Drug use: Yes  ?  Types: Marijuana  ? Sexual activity: Yes  ?  Comment: States no possibility of pregnancy/ approx period 2 months ago/states menopoausal  ? ? ? ? ? ? ?

## 2021-08-05 LAB — SYNOVIAL FLUID ANALYSIS, COMPLETE
Basophils, %: 0 %
Eosinophils-Synovial: 0 % (ref 0–2)
Lymphocytes-Synovial Fld: 30 % (ref 0–74)
Monocyte/Macrophage: 13 % (ref 0–69)
Neutrophil, Synovial: 57 % — ABNORMAL HIGH (ref 0–24)
Synoviocytes, %: 0 % (ref 0–15)
WBC, Synovial: 11180 cells/uL — ABNORMAL HIGH (ref <150)

## 2021-08-05 LAB — GRAM STAIN
MICRO NUMBER:: 13357811
SPECIMEN QUALITY:: ADEQUATE

## 2021-08-10 LAB — ANAEROBIC AND AEROBIC CULTURE
AER RESULT:: NO GROWTH
MICRO NUMBER:: 13357809
MICRO NUMBER:: 13357810
SPECIMEN QUALITY:: ADEQUATE
SPECIMEN QUALITY:: ADEQUATE

## 2021-08-24 ENCOUNTER — Ambulatory Visit: Payer: 59 | Admitting: Orthopaedic Surgery

## 2021-08-24 ENCOUNTER — Encounter: Payer: Self-pay | Admitting: Orthopaedic Surgery

## 2021-08-24 DIAGNOSIS — M1712 Unilateral primary osteoarthritis, left knee: Secondary | ICD-10-CM | POA: Diagnosis not present

## 2021-08-24 MED ORDER — HYDROCODONE-ACETAMINOPHEN 5-325 MG PO TABS: 1.0000 | ORAL_TABLET | Freq: Two times a day (BID) | ORAL | 0 refills | Status: DC | PRN

## 2021-08-24 NOTE — Progress Notes (Signed)
Office Visit Note   Patient: Laurie Hull           Date of Birth: [birth year]           MRN: [id number] Visit Date: 08/24/2021              Requested by: Kerin Perna, NP [address] [place],  [place] [address] PCP: Kerin Perna, NP   Assessment & Plan: Visit Diagnoses:  1. Unilateral primary osteoarthritis, left knee     Plan: Impression is left knee arthritis flareup versus possible underlying meniscal pathology.  At this point, I would like to reaspirate the left knee and injected with cortisone today.  Approximately 20 cc of blood-tinged fluid was aspirated.  If her symptoms do not improve over the next several weeks she will let us know.  I have agreed to call in 1 small prescription of Norco to take sparingly.  Call with concerns or questions in the meantime.  Follow-Up Instructions: Return if symptoms worsen or fail to improve.   Orders:  Orders Placed This Encounter  Procedures   Large Joint Inj   Meds ordered this encounter  Medications   HYDROcodone-acetaminophen (NORCO) 5-325 MG tablet    Sig: Take 1 tablet by mouth 2 (two) times daily as needed.    Dispense:  20 tablet    Refill:  0      Procedures: Large Joint Inj: L knee on 08/24/2021 9:25 AM Indications: pain Details: 22 G needle, anterolateral approach Medications: 2 mL lidocaine 1 %; 2 mL bupivacaine 0.25 %; 40 mg methylPREDNISolone acetate 40 MG/ML     Clinical Data: No additional findings.   Subjective: Chief Complaint  Patient presents with   Left Knee - Pain    HPI patient is a pleasant 56 year old female who comes in today with continued left knee pain.  She was seen in our office about 3 weeks ago with significant pain to the left knee.  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  At the time of her visit, left knee was aspirated which was unremarkable for infection or gout.  She has had continued pain in the left lower extremity worse with bearing  weight flexion of the knee.  The pain is to the entire knee at this point.  She has been taking tramadol which does not help.  She does have a history of left knee arthroscopy several years ago.  No recent history of cortisone injection.  Review of Systems as detailed in HPI.  All others reviewed and are negative.   Objective: Vital Signs: There were no vitals taken for this visit.  Physical Exam well-developed well-nourished female no acute distress.  Alert and oriented x3.  Ortho Exam left knee exam shows a moderate effusion.  Range of motion 10 to 95 degrees.  Medial and lateral joint line tenderness.  Moderate patellofemoral crepitus.  She is neurovascular tact distally.  Specialty Comments:  No specialty comments available.  Imaging: No new imaging   PMFS History: Patient Active Problem List   Diagnosis Date Noted   GERD (gastroesophageal reflux disease) 08/10/2016   Costochondritis 08/10/2016   Obesity 10/21/2014   CRUSHING INJURY OF FINGER 10/15/2008   KNEE PAIN, LEFT 05/25/2008   FIBROIDS, UTERUS 11/29/2006   Past Medical History:  Diagnosis Date   Allergy    SEASONAL   Bell's palsy    EARLY 2000'S   Fibroids    History of anesthesia complications    HARD  TO WAKE    Family History  Problem Relation Age of Onset   Diabetes Mother    Hypertension Mother    Heart disease Father    Hypertension Father    Colon polyps Neg Hx    Colon cancer Neg Hx    Esophageal cancer Neg Hx    Rectal cancer Neg Hx    Stomach cancer Neg Hx     Past Surgical History:  Procedure Laterality Date   HERNIA REPAIR     KNEE SURGERY Left    MYOMECTOMY     WISDOM TOOTH EXTRACTION     Social History   Occupational History   Not on file  Tobacco Use   Smoking status: Never   Smokeless tobacco: Never   Tobacco comments:    smoke weed   Vaping Use   Vaping Use: Never used  Substance and Sexual Activity   Alcohol use: Yes    Alcohol/week: 5.0 standard drinks    Types: 5  Glasses of wine per week    Comment: occ   Drug use: Yes    Types: Marijuana   Sexual activity: Yes    Comment: States no possibility of pregnancy/ approx period 2 months ago/states menopoausal

## 2021-08-25 ENCOUNTER — Telehealth: Payer: Self-pay | Admitting: Physician Assistant

## 2021-08-25 NOTE — Telephone Encounter (Signed)
Patient called. Says she was not able to get her medication. Says they need an auth for hydrocodone from the Dr. Her call back number is [phone]

## 2021-08-26 MED ORDER — BUPIVACAINE HCL 0.25 % IJ SOLN
2.0000 mL | INTRAMUSCULAR | Status: AC | PRN
  Administered 2021-08-24: 2 mL via INTRA_ARTICULAR

## 2021-08-26 MED ORDER — METHYLPREDNISOLONE ACETATE 40 MG/ML IJ SUSP
40.0000 mg | INTRAMUSCULAR | Status: AC | PRN
  Administered 2021-08-24: 40 mg via INTRA_ARTICULAR

## 2021-08-26 MED ORDER — LIDOCAINE HCL 1 % IJ SOLN
2.0000 mL | INTRAMUSCULAR | Status: AC | PRN
  Administered 2021-08-24: 2 mL

## 2021-08-29 ENCOUNTER — Telehealth: Payer: Self-pay

## 2021-08-29 ENCOUNTER — Telehealth: Payer: Self-pay | Admitting: Physician Assistant

## 2021-08-29 NOTE — Telephone Encounter (Signed)
Malva Ruffini (Key: [id number])  [facility] has not yet replied to your PA request. You may close this dialog, return to your dashboard, and perform other tasks.  To check for an update later, open this request again from your dashboard.  If [facility] has not replied within 72 hours for urgent requests and up to 15 days for standard requests, please contact [facility] at [phone].

## 2021-08-29 NOTE — Telephone Encounter (Signed)
PA pending approval   [facility]  RX bin # [phone] RX pcn [facility]

## 2021-08-29 NOTE — Telephone Encounter (Signed)
Pending approval   Laurie Hull (Key: [id number])  Your information has been sent to [facility].

## 2021-08-29 NOTE — Telephone Encounter (Signed)
Laurie Hull (Key: [id number])  Your information has been sent to [facility].

## 2021-08-29 NOTE — Telephone Encounter (Signed)
Pt called requesting an update about her pain meds. Pt states the pharmacy need pre auth for her pain medication and really need them. She went all weekend without them. Please call pt when pre Josem Kaufmann has been sen to pharmacy Pt phone number is [phone].

## 2021-08-30 ENCOUNTER — Other Ambulatory Visit: Payer: Self-pay | Admitting: Physician Assistant

## 2021-08-30 MED ORDER — ACETAMINOPHEN-CODEINE 300-30 MG PO TABS: 1.0000 | ORAL_TABLET | Freq: Three times a day (TID) | ORAL | 0 refills | Status: DC | PRN

## 2021-08-30 NOTE — Telephone Encounter (Signed)
Patient aware. Will call us back. She wants to see if she can pay for Norco RX first if not she will get Tylenol # 3.

## 2021-08-30 NOTE — Telephone Encounter (Signed)
PA was denied by insurance. Can you send something else into pharmacy? Thanks.     Kriti Auer Key: [id number] - PA Case ID: [id number] Need help? Call us at [phone]    Outcome: N/A on May 30   PA Case: [id number], Status: Closed, Closed Reason Code: BY Other - not covered by other values, Closed Rationale: Please note, the member's plan does Not allow more than 2 short-acting opioid fills per 29 days. If applicable, you may prescribe a larger quantity. An override has been placed for the member based on the information provided. Please advise the dispensing pharmacy to re-process claim. . Questions? Contact [phone].

## 2021-08-30 NOTE — Telephone Encounter (Signed)
Allergic to percocet, she has tried tramadol so I sent in tylenol3

## 2021-11-07 ENCOUNTER — Telehealth: Payer: Self-pay | Admitting: Orthopaedic Surgery

## 2021-11-07 DIAGNOSIS — M1712 Unilateral primary osteoarthritis, left knee: Secondary | ICD-10-CM

## 2021-11-07 NOTE — Telephone Encounter (Signed)
Please advise 

## 2021-11-07 NOTE — Telephone Encounter (Signed)
Patient called. She would like an order for a MRI. She now has Garment/textile technologist

## 2021-11-07 NOTE — Telephone Encounter (Signed)
If it's left knee then that's fine.

## 2021-11-08 NOTE — Telephone Encounter (Signed)
Mri ordered. Pt was called and advised 

## 2021-11-19 ENCOUNTER — Other Ambulatory Visit: Payer: 59

## 2021-11-20 ENCOUNTER — Telehealth: Payer: Self-pay | Admitting: Orthopaedic Surgery

## 2021-11-20 NOTE — Telephone Encounter (Signed)
Pt called requesting a call back. Pt states [facility] stated insurance did not approve MRI and to notify Dr Erlinda Hong and see if he can help with pt with reaching out to insurance company for approval. Please call at [phone].

## 2021-11-24 NOTE — Telephone Encounter (Signed)
Waiting on to see if P2P will be getting done.

## 2021-11-30 NOTE — Telephone Encounter (Signed)
Peer to peer has been set up pending appt time

## 2021-12-12 ENCOUNTER — Telehealth: Payer: Self-pay | Admitting: Orthopaedic Surgery

## 2021-12-12 NOTE — Telephone Encounter (Signed)
Pt called stating she feels neglected and been waiting for a call back about a referral for MRI. Pt hasn't spoke to anyone  from anywhere about MRI. Pt states her insurance complaint is still waiting for note to approve MRI. Please call pt about this matter. Please is asking for a call back from Dr Erlinda Hong or Beverely Risen. call pt at [phone].

## 2021-12-20 ENCOUNTER — Telehealth: Payer: Self-pay

## 2021-12-20 NOTE — Telephone Encounter (Signed)
Resubmitted pa request: Novella Rob #[id number] valid 12/20/21 - 06/18/22. I have called the patient and advised her of this. I have called [facility] and spoken with Kerri Perches: appt scheduled for 12/28/21 at 12:50 (needs a specific imaging room, due to her being in a wheelchair). Sent MyChart message to the patient with this appt info. She can call to see if there have been any cancellations, to see if it is possible to get in sooner.

## 2021-12-28 ENCOUNTER — Ambulatory Visit
Admission: RE | Admit: 2021-12-28 | Discharge: 2021-12-28 | Disposition: A | Discharge status: Home / Self Care | Payer: Commercial Managed Care - HMO | Source: Ambulatory Visit | Attending: Physician Assistant | Admitting: Physician Assistant

## 2021-12-28 DIAGNOSIS — M1712 Unilateral primary osteoarthritis, left knee: Secondary | ICD-10-CM

## 2022-01-09 ENCOUNTER — Ambulatory Visit: Payer: Commercial Managed Care - HMO | Admitting: Orthopaedic Surgery

## 2022-01-09 DIAGNOSIS — M1712 Unilateral primary osteoarthritis, left knee: Secondary | ICD-10-CM

## 2022-01-09 MED ORDER — DICLOFENAC SODIUM 75 MG PO TBEC: 75.0000 mg | DELAYED_RELEASE_TABLET | Freq: Two times a day (BID) | ORAL | 2 refills | Status: DC

## 2022-01-09 NOTE — Progress Notes (Signed)
Office Visit Note   Patient: Laurie Hull           Date of Birth: 10/29/1965           MRN: [id number] Visit Date: 01/09/2022              Requested by: Kerin Perna, NP [address] [place],  [place] [address] PCP: Kerin Perna, NP   Assessment & Plan: Visit Diagnoses:  1. Primary osteoarthritis of left knee     Plan: MRI scan shows advanced tricompartmental chondromalacia and degenerative type tears of the menisci all consistent with degenerative joint disease.  Treatment options discussed to include knee replacement which she is not interested in at this time.  I don't recommend scoping the knee just for the meniscus tears as the main finding is DJD and her symptoms are not consistent with meniscal pathology.  She will stay with nonsurgical treatments for now and we discussed that they include NSAIDs, PT, weight loss, knee brace, and others.  She will follow up as needed.  Diclofenac prescribed.    Follow-Up Instructions: No follow-ups on file.   Orders:  No orders of the defined types were placed in this encounter.  Meds ordered this encounter  Medications   diclofenac (VOLTAREN) 75 MG EC tablet    Sig: Take 1 tablet (75 mg total) by mouth 2 (two) times daily.    Dispense:  30 tablet    Refill:  2      Procedures: No procedures performed   Clinical Data: No additional findings.   Subjective: Chief Complaint  Patient presents with   Left Knee - Follow-up    HPI Laurie Hull returns today to discuss left knee MRI.    Review of Systems   Objective: Vital Signs: There were no vitals taken for this visit.  Physical Exam  Ortho Exam Walks to single crutch with antalgic gait.  Knee exam is unchanged Specialty Comments:  No specialty comments available.  Imaging: No results found.   PMFS History: Patient Active Problem List   Diagnosis Date Noted   Primary osteoarthritis of left knee 01/09/2022   GERD (gastroesophageal reflux disease)  08/10/2016   Costochondritis 08/10/2016   Obesity 10/21/2014   CRUSHING INJURY OF FINGER 10/15/2008   KNEE PAIN, LEFT 05/25/2008   FIBROIDS, UTERUS 11/29/2006   Past Medical History:  Diagnosis Date   Allergy    SEASONAL   Bell's palsy    EARLY 2000'S   Fibroids    History of anesthesia complications    HARD TO WAKE    Family History  Problem Relation Age of Onset   Diabetes Mother    Hypertension Mother    Heart disease Father    Hypertension Father    Colon polyps Neg Hx    Colon cancer Neg Hx    Esophageal cancer Neg Hx    Rectal cancer Neg Hx    Stomach cancer Neg Hx     Past Surgical History:  Procedure Laterality Date   HERNIA REPAIR     KNEE SURGERY Left    MYOMECTOMY     WISDOM TOOTH EXTRACTION     Social History   Occupational History   Not on file  Tobacco Use   Smoking status: Never   Smokeless tobacco: Never   Tobacco comments:    smoke weed   Vaping Use   Vaping Use: Never used  Substance and Sexual Activity   Alcohol use: Yes    Alcohol/week: 5.0  standard drinks of alcohol    Types: 5 Glasses of wine per week    Comment: occ   Drug use: Yes    Types: Marijuana   Sexual activity: Yes    Comment: States no possibility of pregnancy/ approx period 2 months ago/states menopoausal

## 2022-02-22 ENCOUNTER — Other Ambulatory Visit: Payer: Self-pay | Admitting: Orthopaedic Surgery

## 2022-02-26 ENCOUNTER — Ambulatory Visit (INDEPENDENT_AMBULATORY_CARE_PROVIDER_SITE_OTHER): Payer: Self-pay | Admitting: *Deleted

## 2022-02-26 NOTE — Telephone Encounter (Signed)
Summary: Vaginal Discharge, needs appt   Pt called reporting that she has discharge and wants to be seen urgently. No appt until December 5th  Best contact: [phone]       Chief Complaint: vaginal discharge, requesting medication / appt  Symptoms: vaginal thick discharge.  Frequency: approx. 3 days / over the weekend  Pertinent Negatives: Patient denies fever, no abdominal pain, rash no urinary pain. No itching. Disposition: '[]'$ ED /'[]'$ Urgent Care (no appt availability in office) / '[]'$ Appointment(In office/virtual)/ '[]'$  [facility]/ '[]'$ Home Care/ '[]'$ Refused Recommended Disposition /'[x]'$ [facility]/ '[]'$  Follow-up with PCP Additional Notes:   No available appt . [employer]. Patient requesting to see PCP for this issue only. Please advise .      Reason for Disposition  [1] Symptoms of a "yeast infection" (i.e., itchy, white discharge, not bad smelling) AND [2] not improved > 3 days following Care Advice  Answer Assessment - Initial Assessment Questions 1. DISCHARGE: "Describe the discharge." (e.g., white, yellow, green, gray, foamy, cottage cheese-like)     Thick discharge  2. ODOR: "Is there a bad odor?"     na 3. ONSET: "When did the discharge begin?"     Over the weekend approx 3 days  4. RASH: "Is there a rash in the genital area?" If Yes, ask: "Describe it." (e.g., redness, blisters, sores, bumps)     no 5. ABDOMEN PAIN: "Are you having any abdomen pain?" If Yes, ask: "What does it feel like? " (e.g., crampy, dull, intermittent, constant)      No  6. ABDOMEN PAIN SEVERITY: If present, ask: "How bad is it?" (e.g., Scale 1-10; mild, moderate, or severe)   - MILD (1-3): Doesn't interfere with normal activities, abdomen soft and not tender to touch.    - MODERATE (4-7): Interferes with normal activities or awakens from sleep, abdomen tender to touch.    - SEVERE (8-10): Excruciating pain, doubled over, unable to do any normal activities. (R/O peritonitis)       na 7. CAUSE: "What do you think is causing the discharge?" "Have you had the same problem before? What happened then?"     Na  8. OTHER SYMPTOMS: "Do you have any other symptoms?" (e.g., fever, itching, vaginal bleeding, pain with urination, injury to genital area, vaginal foreign body)     No itching no pain with urination. Only vaginal thick discharge  9. PREGNANCY: "Is there any chance you are pregnant?" "When was your last menstrual period?"     na  Protocols used: Vaginal Discharge-A-AH

## 2022-02-27 ENCOUNTER — Other Ambulatory Visit (HOSPITAL_COMMUNITY)
Admission: RE | Admit: 2022-02-27 | Discharge: 2022-02-27 | Disposition: A | Discharge status: Home / Self Care | Payer: Commercial Managed Care - HMO | Source: Ambulatory Visit | Attending: Primary Care | Admitting: Primary Care

## 2022-02-27 ENCOUNTER — Ambulatory Visit (INDEPENDENT_AMBULATORY_CARE_PROVIDER_SITE_OTHER): Payer: Commercial Managed Care - HMO

## 2022-02-27 DIAGNOSIS — N898 Other specified noninflammatory disorders of vagina: Secondary | ICD-10-CM

## 2022-02-27 NOTE — Telephone Encounter (Signed)
Pt had been contacted and a nurse visit has been schedule for vaginal swab

## 2022-03-01 LAB — CERVICOVAGINAL ANCILLARY ONLY
Bacterial Vaginitis (gardnerella): NEGATIVE
Candida Glabrata: NEGATIVE
Candida Vaginitis: NEGATIVE
Chlamydia: NEGATIVE
Comment: NEGATIVE
Comment: NEGATIVE
Comment: NEGATIVE
Comment: NEGATIVE
Comment: NEGATIVE
Comment: NORMAL
Neisseria Gonorrhea: NEGATIVE
Trichomonas: NEGATIVE

## 2022-03-02 ENCOUNTER — Encounter (INDEPENDENT_AMBULATORY_CARE_PROVIDER_SITE_OTHER): Payer: Self-pay | Admitting: Primary Care

## 2022-03-07 ENCOUNTER — Ambulatory Visit (INDEPENDENT_AMBULATORY_CARE_PROVIDER_SITE_OTHER): Payer: Commercial Managed Care - HMO

## 2022-03-07 DIAGNOSIS — Z111 Encounter for screening for respiratory tuberculosis: Secondary | ICD-10-CM

## 2022-03-08 ENCOUNTER — Encounter: Payer: Self-pay | Admitting: Physical Therapy

## 2022-03-08 ENCOUNTER — Ambulatory Visit (INDEPENDENT_AMBULATORY_CARE_PROVIDER_SITE_OTHER): Payer: Commercial Managed Care - HMO | Admitting: Physical Therapy

## 2022-03-08 ENCOUNTER — Other Ambulatory Visit: Payer: Self-pay

## 2022-03-08 DIAGNOSIS — M25662 Stiffness of left knee, not elsewhere classified: Secondary | ICD-10-CM | POA: Diagnosis not present

## 2022-03-08 DIAGNOSIS — M6281 Muscle weakness (generalized): Secondary | ICD-10-CM

## 2022-03-08 DIAGNOSIS — R262 Difficulty in walking, not elsewhere classified: Secondary | ICD-10-CM | POA: Diagnosis not present

## 2022-03-08 DIAGNOSIS — M25562 Pain in left knee: Secondary | ICD-10-CM

## 2022-03-08 DIAGNOSIS — G8929 Other chronic pain: Secondary | ICD-10-CM

## 2022-03-08 NOTE — Therapy (Addendum)
OUTPATIENT PHYSICAL THERAPY LOWER EXTREMITY EVALUATION [place]   Patient Name: Laurie Hull MRN: [id number] DOB:Apr 24, 1965, 56 y.o., female Today's Date: 03/08/2022  END OF SESSION:  PT End of Session - 03/08/22 1445     Visit Number 1    Number of Visits 16    Date for PT Re-Evaluation 05/10/22    PT Start Time 1300    PT Stop Time 8242    PT Time Calculation (min) 48 min    Activity Tolerance Patient limited by pain    Behavior During Therapy [facility] for tasks assessed/performed             Past Medical History:  Diagnosis Date   Allergy    SEASONAL   Bell's palsy    EARLY 2000'S   Fibroids    History of anesthesia complications    HARD TO WAKE   Past Surgical History:  Procedure Laterality Date   HERNIA REPAIR     KNEE SURGERY Left    MYOMECTOMY     WISDOM TOOTH EXTRACTION     Patient Active Problem List   Diagnosis Date Noted   Primary osteoarthritis of left knee 01/09/2022   GERD (gastroesophageal reflux disease) 08/10/2016   Costochondritis 08/10/2016   Obesity 10/21/2014   CRUSHING INJURY OF FINGER 10/15/2008   KNEE PAIN, LEFT 05/25/2008   FIBROIDS, UTERUS 11/29/2006    PCP: Kerin Perna, NP  REFERRING PROVIDER: Velora Mediate, MD  REFERRING DIAG:  Osteoarthritis of left knee, unspecified osteoarthritis type [M17.12]   THERAPY DIAG:  Stiffness of left knee, not elsewhere classified - Plan: PT plan of care cert/re-cert  Chronic pain of left knee - Plan: PT plan of care cert/re-cert  Difficulty in walking, not elsewhere classified - Plan: PT plan of care cert/re-cert  Muscle weakness (generalized) - Plan: PT plan of care cert/re-cert  Rationale for Evaluation and Treatment: Rehabilitation  ONSET DATE: Ongoing for 6+ months  SUBJECTIVE:   SUBJECTIVE STATEMENT: Pt is a CNA, 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 She states that she is on her feet a lot resulting in fluid build up in her L knee. Fluid was extracted, but came  back resulting in another extraction a month or two later. Pt has been out of work for several months due to inability to put weight through her L knee. She followed up with her MD, who ended up giving her an MRI which showed OA. She was advised she would need a TKA, but refused due to her age. Pt received 3 HA shots, which she has found beneficial. Pt reports no strength in her L knee due to 7 months of pain and intermittent weight bearing.   PERTINENT HISTORY: Bell's Palsy, L knee sx.  PAIN:  Are you having pain? Yes: NPRS scale: 0-7 /10 Pain location: L knee Pain description: Sharp pain Aggravating factors: WB, and knee extension Relieving factors: Sitting down.  PRECAUTIONS: None  WEIGHT BEARING RESTRICTIONS: No  FALLS:  Has patient fallen in last 6 months? 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   PLOF: Independent  PATIENT GOALS: Pt would like to regain her strength in her knee and be able to work and walk normally again.   NEXT MD VISIT:   OBJECTIVE:   DIAGNOSTIC FINDINGS: 1. Peripheral longitudinal tear of the medial meniscus  body. Additional radial tear of the posterior horn. 2. Radial tears of the lateral meniscus anterior and posterior horns. 3. 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  PATIENT SURVEYS:  FOTO 21.51%, 55% in 17 visits.   COGNITION: Overall cognitive status: Within functional limits for tasks assessed     SENSATION: WFL  EDEMA:  Circumferential: 22" L knee, 21" R knee  POSTURE: No Significant postural limitations  PALPATION: None.   LOWER EXTREMITY ROM:  Active ROM Right eval Left eval  Knee flexion WFL 112 with pt in brace and jeans   Knee extension WFL -30/-22 , pt reports a lot of pressure.    (Blank  rows = not tested)  LOWER EXTREMITY MMT:  MMT Right eval Left eval  Hip flexion 5 Unable to test due to pain  Knee flexion 5 Unable to test due to pain  Knee extension 5 Unable to test due to pain   (Blank rows = not tested)  FUNCTIONAL TESTS:  5 x STS: 48.71 sec, no hands, pt wearing brace and in a lot of pain to get through 4, but was determined.  TUG: Pt in too much pain to assess today.  GAIT: Distance walked: 25f  Assistive device utilized:  Knee Brace.  Level of assistance: Modified independence Comments: Pt with antalgic gait pattern with decreased weight bearing on L LE.    TODAY'S TREATMENT:                                                                                                                              DATE: Creating, reviewing, and completing below HEP    PATIENT EDUCATION:  Education details: Educated pt on anatomy and physiology of current symptoms, FOTO, diagnosis, prognosis, HEP,  and POC. Person educated: Patient Education method: ECustomer service managerEducation comprehension: verbalized understanding and returned demonstration  HOME EXERCISE PROGRAM: Access Code: [email or web address]: [email or web address] Date: 03/08/2022 Prepared by: SRudi Heap Exercises - Supine Heel Slide with Strap  - 2 x daily - 7 x weekly - 3 sets - 10 reps - Supine Quad Set  - 2 x daily - 7 x weekly - 3 sets - 10 reps - Seated Heel Slide  - 2 x daily - 7 x weekly - 3 sets - 10 reps   ASSESSMENT:  CLINICAL IMPRESSION: Patient referred to PT for L knee OA. She demonstrates decreased ROM and strength in L knee secondary to pain. Pt unable to stand for extended periods of time. Patient will benefit from skilled PT to address below impairments, limitations and improve overall function.  OBJECTIVE IMPAIRMENTS: decreased activity tolerance, difficulty walking, decreased balance, decreased endurance, decreased mobility, decreased ROM, decreased strength,  impaired flexibility, impaired LE use, postural dysfunction, and pain.  ACTIVITY LIMITATIONS: bending, lifting, carry, locomotion, cleaning, community activity, driving, and or occupation  PERSONAL FACTORS: Bell's Palsy, L knee sx. are also affecting patient's functional outcome.  REHAB POTENTIAL: Good  CLINICAL DECISION  MAKING: Stable/uncomplicated  EVALUATION COMPLEXITY: Low    GOALS: Short term PT Goals Target date: 04/05/2022 Pt will be I and compliant with HEP. Baseline:  Goal status: New Pt will decrease pain by 25% overall with functional movements Baseline: Goal status: New  Long term PT goals Target date: 05/03/2022 Pt will improve ROM to [facility] to improve functional mobility Baseline: Goal status: New Pt will improve  hip/knee strength to at least 4+/5 MMT to improve functional strength Baseline: Goal status: New Pt will improve FOTO to at least 55% functional to show improved function Baseline: Goal status: New Pt will reduce pain by overall 50% overall with usual activity Baseline: Goal status: New Pt will reduce pain to overall less than 2-3/10 with usual activity and work activity. Baseline: Goal status: New Pt will be able to ambulate community distances at least 1000 ft WNL gait pattern without complaints Baseline: Goal status: New       7. Pt will improve her STS by 2.3 seconds for Minimal clinically important difference.       Baseline:       Goal status: New  PLAN: PT FREQUENCY: 1-2 times per week   PT DURATION: 8 weeks  PLANNED INTERVENTIONS (unless contraindicated): aquatic PT, Canalith repositioning, cryotherapy, Electrical stimulation, Iontophoresis with 4 mg/ml dexamethasome, Moist heat, traction, Ultrasound, gait training, Therapeutic exercise, balance training, neuromuscular re-education, patient/family education, prosthetic training, manual techniques, passive ROM, dry needling, taping, vasopnuematic device, vestibular, spinal manipulations,  joint manipulations  PLAN FOR NEXT SESSION: Assess HEP/update PRN, continue to progress functional mobility, strengthen proximal hip muscles. Decrease patients pain and help minimize functional deficits. Work on balance and gait.     Lynden Ang, PT 03/08/2022, 2:47 PM   PHYSICAL THERAPY DISCHARGE SUMMARY  Visits from Start of Care: 1  Current functional level related to goals / functional outcomes: See note   Remaining deficits: See note   Education / Equipment: HEP  Patient goals were not met. Patient is being discharged due to not returning since the last visit.  Scot Jun, PT, DPT, OCS, ATC 04/26/22  3:29 PM

## 2022-03-09 ENCOUNTER — Encounter (INDEPENDENT_AMBULATORY_CARE_PROVIDER_SITE_OTHER): Payer: Self-pay

## 2022-03-12 ENCOUNTER — Ambulatory Visit (HOSPITAL_BASED_OUTPATIENT_CLINIC_OR_DEPARTMENT_OTHER): Payer: Commercial Managed Care - HMO | Admitting: Physical Therapy

## 2022-03-21 ENCOUNTER — Ambulatory Visit (HOSPITAL_BASED_OUTPATIENT_CLINIC_OR_DEPARTMENT_OTHER): Payer: Commercial Managed Care - HMO | Admitting: Physical Therapy

## 2022-03-23 ENCOUNTER — Ambulatory Visit (HOSPITAL_BASED_OUTPATIENT_CLINIC_OR_DEPARTMENT_OTHER): Payer: Commercial Managed Care - HMO | Admitting: Physical Therapy

## 2022-03-29 ENCOUNTER — Encounter (HOSPITAL_BASED_OUTPATIENT_CLINIC_OR_DEPARTMENT_OTHER): Payer: Commercial Managed Care - HMO | Admitting: Physical Therapy

## 2022-04-11 LAB — TB SKIN TEST
Induration: 0 mm
TB Skin Test: NEGATIVE

## 2022-05-30 ENCOUNTER — Ambulatory Visit: Payer: Commercial Managed Care - HMO | Admitting: Physician Assistant

## 2022-05-30 ENCOUNTER — Encounter: Payer: Self-pay | Admitting: Physician Assistant

## 2022-05-30 ENCOUNTER — Ambulatory Visit (HOSPITAL_BASED_OUTPATIENT_CLINIC_OR_DEPARTMENT_OTHER)
Admission: RE | Admit: 2022-05-30 | Discharge: 2022-05-30 | Disposition: A | Discharge status: Home / Self Care | Payer: Commercial Managed Care - HMO | Source: Ambulatory Visit | Attending: Physician Assistant | Admitting: Physician Assistant

## 2022-05-30 ENCOUNTER — Encounter (HOSPITAL_BASED_OUTPATIENT_CLINIC_OR_DEPARTMENT_OTHER): Payer: Self-pay | Admitting: Radiology

## 2022-05-30 VITALS — BP 173/90 | HR 96 | Ht 68.0 in | Wt 250.0 lb

## 2022-05-30 DIAGNOSIS — R030 Elevated blood-pressure reading, without diagnosis of hypertension: Secondary | ICD-10-CM | POA: Diagnosis not present

## 2022-05-30 DIAGNOSIS — M25572 Pain in left ankle and joints of left foot: Secondary | ICD-10-CM

## 2022-05-30 MED ORDER — IBUPROFEN 800 MG PO TABS: 800.0000 mg | ORAL_TABLET | Freq: Three times a day (TID) | ORAL | 0 refills | Status: DC | PRN

## 2022-05-30 NOTE — Patient Instructions (Signed)
To help with your foot and ankle pain, I encourage you to keep your foot elevated, use compression, ice, and ibuprofen 800 mg.  You can use the ibuprofen every 8 hours.  We will call you with the results of your x-ray when they are available.  Your blood pressure is elevated today, I encourage you to check your blood pressure at home, keep a written log and have available for all office visits.  Please return to the mobile unit or follow-up with your primary care provider if blood pressure readings continue to be elevated.  I hope that you feel better soon.  Kennieth Rad, PA-C Physician Assistant [facility] [email or web address]   Ankle Pain The ankle joint holds your body weight and allows you to move around. Ankle pain can occur on either side or the back of one ankle or both ankles. Ankle pain may be sharp and burning or dull and aching. There may be tenderness, stiffness, redness, or warmth around the ankle. Many things can cause ankle pain, including an injury to the area and overuse of the ankle. Follow these instructions at home: Activity Rest your ankle as told by your health care provider. Avoid any activities that cause ankle pain. Do not use the injured limb to support your body weight until your health care provider says that you can. Use crutches as told by your health care provider. Do exercises as told by your health care provider. Ask your health care provider when it is safe to drive if you have a brace on your ankle. If you have a brace: Wear the brace as told by your health care provider. Remove it only as told by your health care provider. Loosen the brace if your toes tingle, become numb, or turn cold and blue. Keep the brace clean. If the brace is not waterproof: Do not let it get wet. Cover it with a watertight covering when you take a bath or shower. If you were given an elastic bandage:  Remove it when you  take a bath or a shower. Try not to move your ankle very much, but wiggle your toes from time to time. This helps to prevent swelling. Adjust the bandage to make it more comfortable if it feels too tight. Loosen the bandage if you have numbness or tingling in your foot or if your foot turns cold and blue. Managing pain, stiffness, and swelling  If directed, put ice on the painful area. If you have a removable brace or elastic bandage, remove it as told by your health care provider. Put ice in a plastic bag. Place a towel between your skin and the bag. Leave the ice on for 20 minutes, 2-3 times a day. Move your toes often to avoid stiffness and to lessen swelling. Raise (elevate) your ankle above the level of your heart while you are sitting or lying down. General instructions Record information about your pain. Writing down the following may be helpful for you and your health care provider: How often you have ankle pain. Where the pain is located. What the pain feels like. If treatment involves wearing a prescribed shoe or insole, make sure you wear it correctly and for as long as told by your health care provider. Take over-the-counter and prescription medicines only as told by your health care provider. Keep all follow-up visits as told by your health care provider. This is important. Contact a health care provider if: Your pain gets worse. Your pain is not  relieved with medicines. You have a fever or chills. You are having more trouble with walking. You have new symptoms. Get help right away if: Your foot, leg, toes, or ankle: Tingles or becomes numb. Becomes swollen. Turns pale or blue. Summary Ankle pain can occur on either side or the back of one ankle or both ankles. Ankle pain may be sharp and burning or dull and aching. Rest your ankle as told by your health care provider. If told, apply ice to the area. Take over-the-counter and prescription medicines only as told by  your health care provider. This information is not intended to replace advice given to you by your health care provider. Make sure you discuss any questions you have with your health care provider. Document Revised: 05/10/2020 Document Reviewed: 05/12/2020 Elsevier Patient Education  [place].

## 2022-05-30 NOTE — Progress Notes (Signed)
Established Patient Office Visit  Subjective   Patient ID: Laurie Hull, female    DOB: 06-Jun-1965  Age: 57 y.o. MRN: [id number]  Chief Complaint  Patient presents with   Ankle Injury    Left Ankle, Missed step and twisted ankle.     States that she missed a stair earlier today and twisted her left ankle.  States that she has been having some swelling.  States that she has been using ice with a small amount of relief.  States that she has not taking any over-the-counter pain medications.  States she is having a little bit of numbness, but denies tingling.    States that she does not check her blood pressure at home, states that she has not previously been treated for hypertension.    Past Medical History:  Diagnosis Date   Allergy    SEASONAL   Bell's palsy    EARLY 2000'S   Fibroids    History of anesthesia complications    HARD TO WAKE   Social History   Socioeconomic History   Marital status: Single    Spouse name: Not on file   Number of children: Not on file   Years of education: Not on file   Highest education level: Not on file  Occupational History   Not on file  Tobacco Use   Smoking status: Never   Smokeless tobacco: Never   Tobacco comments:    smoke weed   Vaping Use   Vaping Use: Never used  Substance and Sexual Activity   Alcohol use: Yes    Alcohol/week: 5.0 standard drinks of alcohol    Types: 5 Glasses of wine per week    Comment: occ   Drug use: Yes    Types: Marijuana   Sexual activity: Yes    Comment: States no possibility of pregnancy/ approx period 2 months ago/states menopoausal  Other Topics Concern   Not on file  Social History Narrative   Not on file   Social Determinants of Health   Financial Resource Strain: Not on file  Food Insecurity: Not on file  Transportation Needs: Not on file  Physical Activity: Not on file  Stress: Not on file  Social Connections: Not on file  Intimate Partner Violence: Not on file   Family  History  Problem Relation Age of Onset   Diabetes Mother    Hypertension Mother    Heart disease Father    Hypertension Father    Colon polyps Neg Hx    Colon cancer Neg Hx    Esophageal cancer Neg Hx    Rectal cancer Neg Hx    Stomach cancer Neg Hx    Allergies  Allergen Reactions   Oxycodone-Acetaminophen Itching and Rash    Review of Systems  Constitutional: Negative.   HENT: Negative.    Eyes: Negative.   Respiratory:  Negative for shortness of breath.   Cardiovascular:  Negative for chest pain.  Gastrointestinal: Negative.   Genitourinary: Negative.   Musculoskeletal:  Positive for joint pain.  Skin: Negative.   Neurological: Negative.   Endo/Heme/Allergies: Negative.   Psychiatric/Behavioral: Negative.        Objective:     BP (!) 173/90   Pulse 96   Ht '5\' 8"'$  (1.727 m)   Wt 250 lb (113.4 kg)   SpO2 99%   BMI 38.01 kg/m    Physical Exam Vitals and nursing note reviewed.  Constitutional:      Appearance: Normal appearance.  HENT:  Head: Atraumatic.     Right Ear: External ear normal.     Left Ear: External ear normal.     Nose: Nose normal.     Mouth/Throat:     Mouth: Mucous membranes are moist.     Pharynx: Oropharynx is clear.  Eyes:     Extraocular Movements: Extraocular movements intact.     Conjunctiva/sclera: Conjunctivae normal.     Pupils: Pupils are equal, round, and reactive to light.  Cardiovascular:     Rate and Rhythm: Normal rate and regular rhythm.     Pulses: Normal pulses.  Pulmonary:     Effort: Pulmonary effort is normal.     Breath sounds: Normal breath sounds.  Musculoskeletal:     Cervical back: Normal range of motion and neck supple.     Right ankle: Normal.     Left ankle: Swelling present. Tenderness present. Decreased range of motion.     Right foot: Normal.     Left foot: Decreased range of motion. Swelling present.     Comments: Tender to light touch.  Pain elicited with range of motion testing  Skin:     General: Skin is warm and dry.  Neurological:     General: No focal deficit present.     Mental Status: She is alert and oriented to person, place, and time.  Psychiatric:        Mood and Affect: Mood normal.        Behavior: Behavior normal.        Thought Content: Thought content normal.        Judgment: Judgment normal.         Assessment & Plan:   Problem List Items Addressed This Visit   None Visit Diagnoses     Acute left ankle pain    -  Primary   Relevant Medications   ibuprofen (ADVIL) 800 MG tablet   Other Relevant Orders   DG Ankle Complete Left   DG Foot Complete Left   Elevated blood pressure reading in office without diagnosis of hypertension         1. Acute left ankle pain Trial ibuprofen, patient education given on supportive care, red flags given for prompt reevaluation. - DG Ankle Complete Left; Future - DG Foot Complete Left; Future - ibuprofen (ADVIL) 800 MG tablet; Take 1 tablet (800 mg total) by mouth every 8 (eight) hours as needed for moderate pain.  Dispense: 60 tablet; Refill: 0  2. Elevated blood pressure reading in office without diagnosis of hypertension Patient encouraged to check blood pressure at home, keep a written log and have available for all office visits.   I have reviewed the patient's medical history (PMH, PSH, Social History, Family History, Medications, and allergies) , and have been updated if relevant. I spent 20 minutes reviewing chart and  face to face time with patient.     Return if symptoms worsen or fail to improve.    [name], PA-C

## 2022-05-31 ENCOUNTER — Encounter (INDEPENDENT_AMBULATORY_CARE_PROVIDER_SITE_OTHER): Payer: Self-pay | Admitting: Primary Care

## 2022-05-31 ENCOUNTER — Telehealth: Payer: Self-pay | Admitting: Primary Care

## 2022-05-31 ENCOUNTER — Telehealth (INDEPENDENT_AMBULATORY_CARE_PROVIDER_SITE_OTHER): Payer: Self-pay | Admitting: Primary Care

## 2022-05-31 NOTE — Telephone Encounter (Signed)
Copied from [place] [id number]. Topic: General - Inquiry >> May 31, 2022 10:09 AM Marcellus Scott wrote: Reason for CRM:Pt stated she was seen at the mobile unit on 05/30/2022 and stated she twisted her left ankle. PCP was not available, so it was suggested to her to go be seen by the mobile unit. Stated Ms.Cari Mayers wrote her a note for work; however, her job is not accepting it, stating that it has no logos, and it looks like she may have just printed it.  Pt is asking if there is anything we can do to help her and get another note for her employer.Stated note needs to be on letterhead.  She asked if it could be rewritten and sent to her employer at the email below.  [email or web address]  Please advise.

## 2022-05-31 NOTE — Telephone Encounter (Signed)
Pt calling back stated her employer still couldn't authenticate because the nurse called from a number that was not on the letter. She stated she was seen at the mobile unit, and they want to speak with someone there. Pt asked if someone could please call her employer at [phone].  Stated she was put on suspension because they couldn't authenticate the letter.  Please advise.

## 2022-05-31 NOTE — Telephone Encounter (Signed)
Letter is being emailed to information below

## 2022-05-31 NOTE — Telephone Encounter (Signed)
Received a message from [facility] stating the patient has requested someone call her job and explain to them how she was seen at the mobile clinic because Sharyn Lull was not in the office. She stated there was a letter that she received when she was seen at the mobile clinic. But because it was not on letterhead, they found it to be fake. I called the number given to me by Ms. Debona [phone], and her permission to call them. I spoke with [place], and asked him if I were to send another letter on letterhead, would that be feasible. I sent the email to [email or web address], he said he would call the patient.

## 2022-06-06 ENCOUNTER — Other Ambulatory Visit: Payer: Self-pay

## 2022-06-06 ENCOUNTER — Emergency Department (HOSPITAL_BASED_OUTPATIENT_CLINIC_OR_DEPARTMENT_OTHER): Payer: Commercial Managed Care - HMO

## 2022-06-06 ENCOUNTER — Emergency Department (HOSPITAL_BASED_OUTPATIENT_CLINIC_OR_DEPARTMENT_OTHER)
Admission: EM | Admit: 2022-06-06 | Discharge: 2022-06-06 | Disposition: A | Discharge status: Home / Self Care | Payer: Commercial Managed Care - HMO | Attending: Emergency Medicine | Admitting: Emergency Medicine

## 2022-06-06 ENCOUNTER — Encounter (HOSPITAL_BASED_OUTPATIENT_CLINIC_OR_DEPARTMENT_OTHER): Payer: Self-pay | Admitting: Emergency Medicine

## 2022-06-06 DIAGNOSIS — M545 Low back pain, unspecified: Secondary | ICD-10-CM | POA: Insufficient documentation

## 2022-06-06 LAB — URINALYSIS, ROUTINE W REFLEX MICROSCOPIC
Bilirubin Urine: NEGATIVE
Glucose, UA: NEGATIVE mg/dL
Hgb urine dipstick: NEGATIVE
Ketones, ur: NEGATIVE mg/dL
Leukocytes,Ua: NEGATIVE
Nitrite: NEGATIVE
Protein, ur: NEGATIVE mg/dL
Specific Gravity, Urine: 1.019 (ref 1.005–1.030)
pH: 6 (ref 5.0–8.0)

## 2022-06-06 LAB — BASIC METABOLIC PANEL
Anion gap: 9 (ref 5–15)
BUN: 9 mg/dL (ref 6–20)
CO2: 29 mmol/L (ref 22–32)
Calcium: 10 mg/dL (ref 8.9–10.3)
Chloride: 103 mmol/L (ref 98–111)
Creatinine, Ser: 0.72 mg/dL (ref 0.44–1.00)
GFR, Estimated: >60 mL/min (ref >60)
Glucose, Bld: 97 mg/dL (ref 70–99)
Potassium: 3.9 mmol/L (ref 3.5–5.1)
Sodium: 141 mmol/L (ref 135–145)

## 2022-06-06 LAB — CBC
HCT: 36 % (ref 36.0–46.0)
Hemoglobin: 12 g/dL (ref 12.0–15.0)
MCH: 30.2 pg (ref 26.0–34.0)
MCHC: 33.3 g/dL (ref 30.0–36.0)
MCV: 90.5 fL (ref 80.0–100.0)
Platelets: 307 10*3/uL (ref 150–400)
RBC: 3.98 MIL/uL (ref 3.87–5.11)
RDW: 12.3 % (ref 11.5–15.5)
WBC: 4.4 10*3/uL (ref 4.0–10.5)
nRBC: 0 % (ref 0.0–0.2)

## 2022-06-06 MED ORDER — METHOCARBAMOL 500 MG PO TABS: 500.0000 mg | ORAL_TABLET | Freq: Three times a day (TID) | ORAL | 0 refills | Status: DC | PRN

## 2022-06-06 MED ORDER — KETOROLAC TROMETHAMINE 15 MG/ML IJ SOLN
15.0000 mg | Freq: Once | INTRAMUSCULAR | Status: AC
  Administered 2022-06-06: 15 mg via INTRAVENOUS
  Filled 2022-06-06: qty 1

## 2022-06-06 NOTE — ED Notes (Signed)
ED Provider at bedside. 

## 2022-06-06 NOTE — ED Provider Notes (Signed)
Laurie Hull Provider Note   CSN: [id number] Arrival date & time: 06/06/22  [id number]     History  Chief Complaint  Patient presents with   Flank Pain    Laurie Hull is a 57 y.o. female.   Flank Pain  Patient presents with left flank back/groin pain.  Began around 2 weeks ago.  Also has had urinary frequency.  Did have a fall in the shower about 2-1/2 weeks ago.  States she slipped on some soap and landed onto her back.  Does have chronic left knee pain and is getting treatment does not want a replacement however.  No abdominal pain.  No fevers.  Worse with certain movements.  Improved with certain positions.  No fevers but states she has had hot flashes but does not know if it is just her menopause.    Past Medical History:  Diagnosis Date   Allergy    SEASONAL   Bell's palsy    EARLY 2000'S   Fibroids    History of anesthesia complications    HARD TO WAKE    Home Medications Prior to Admission medications   Medication Sig Start Date End Date Taking? Authorizing Provider  methocarbamol (ROBAXIN) 500 MG tablet Take 1 tablet (500 mg total) by mouth every 8 (eight) hours as needed for muscle spasms. 06/06/22  Yes Davonna Belling, MD  acetaminophen-codeine (TYLENOL #3) 300-30 MG tablet Take 1 tablet by mouth every 8 (eight) hours as needed for moderate pain. 08/30/21   Aundra Dubin, PA-C  diclofenac (VOLTAREN) 75 MG EC tablet Take 1 tablet (75 mg total) by mouth 2 (two) times daily. Patient not taking: Reported on 05/30/2022 01/09/22   Leandrew Koyanagi, MD  HYDROcodone-acetaminophen (NORCO) 5-325 MG tablet Take 1 tablet by mouth 2 (two) times daily as needed. Patient not taking: Reported on 05/30/2022 08/24/21   Aundra Dubin, PA-C  ibuprofen (ADVIL) 800 MG tablet Take 1 tablet (800 mg total) by mouth every 8 (eight) hours as needed for moderate pain. 05/30/22   Mayers, Cari S, PA-C  traMADol (ULTRAM) 50 MG tablet Take 1 tablet (50 mg  total) by mouth 3 (three) times daily as needed. Patient not taking: Reported on 05/30/2022 08/04/21   Aundra Dubin, PA-C      Allergies    Oxycodone-acetaminophen    Review of Systems   Review of Systems  Genitourinary:  Positive for flank pain.    Physical Exam Updated Vital Signs BP (!) 140/80 (BP Location: Right Arm)   Pulse 86   Temp 99 F (37.2 C) (Oral)   Resp 16   Ht '5\' 8"'$  (1.727 m)   Wt 114.3 kg   SpO2 100%   BMI 38.32 kg/m  Physical Exam Vitals and nursing note reviewed.  Musculoskeletal:        General: Tenderness present.     Cervical back: Neck supple.     Comments: Lumbar tenderness.  Left lower flank tenderness also.  No rash.  Skin:    Capillary Refill: Capillary refill takes less than 2 seconds.  Neurological:     Mental Status: She is alert and oriented to person, place, and time.     ED Results / Procedures / Treatments   Labs (all labs ordered are listed, but only abnormal results are displayed) Labs Reviewed  URINALYSIS, [place] PANEL  CBC    EKG None  Radiology CT Renal Stone Study  Result Date:  06/06/2022 CLINICAL DATA:  Left flank pain for 2 weeks EXAM: CT ABDOMEN AND PELVIS WITHOUT CONTRAST TECHNIQUE: Multidetector CT imaging of the abdomen and pelvis was performed following the standard protocol without IV contrast. RADIATION DOSE REDUCTION: This exam was performed according to the departmental dose-optimization program which includes automated exposure control, adjustment of the mA and/or kV according to patient size and/or use of iterative reconstruction technique. COMPARISON:  CT 12/02/2007 FINDINGS: Lower chest: Included lung bases are clear.  Heart size is normal. Hepatobiliary: Unremarkable unenhanced appearance of the liver. No focal liver lesion identified. Gallbladder within normal limits. No hyperdense gallstone. No biliary dilatation. Pancreas: Unremarkable. No pancreatic ductal dilatation  or surrounding inflammatory changes. Spleen: Normal in size without focal abnormality. Adrenals/Urinary Tract: Adrenal glands are unremarkable. Kidneys are normal, without renal calculi, focal lesion, or hydronephrosis. No ureteral calculi are identified. Urinary bladder is decompressed, limiting its evaluation. Stomach/Bowel: Stomach is within normal limits. No evidence of appendicitis. No evidence of bowel wall thickening, distention, or inflammatory changes. Vascular/Lymphatic: No significant vascular findings are present. No enlarged abdominal or pelvic lymph nodes. Reproductive: Multifibroid uterus. Overall, the uterus has decreased in size compared to the previous CT. Patient has a history of myomectomy. No adnexal masses. Other: No free fluid. No abdominopelvic fluid collection. No pneumoperitoneum. No abdominal wall hernia. Musculoskeletal: No acute or significant osseous findings. IMPRESSION: 1. No acute abdominopelvic findings. Specifically, no evidence of obstructive uropathy. 2. Multifibroid uterus. Overall, the uterus has decreased in size compared to the previous CT, compatible with history of prior myomectomy. Electronically Signed   By: Davina Poke D.O.   On: 06/06/2022 09:46    Procedures Procedures    Medications Ordered in ED Medications  ketorolac (TORADOL) 15 MG/ML injection 15 mg (15 mg Intravenous Given 06/06/22 0900)    ED Course/ Medical Decision Making/ A&P                             Medical Decision Making Amount and/or Complexity of Data Reviewed Labs: ordered. Radiology: ordered.  Risk Prescription drug management.   Patient with flank pain.  Also urinary frequency.  Did have a fall.  Differential diagnose includes kidney stone, infection, musculoskeletal pain.  Urinalysis reassuring.  Will get basic blood work and CT imaging to evaluate for stone or injury. Workup reassuring.  Urine does not show infection.  CT scan does not show clear cause of the pain.   Potentially musculoskeletal.  Will treat symptomatically and follow-up as an outpatient.       Final Clinical Impression(s) / ED Diagnoses Final diagnoses:  Low back pain without sciatica, unspecified back pain laterality, unspecified chronicity    Rx / DC Orders ED Discharge Orders          Ordered    methocarbamol (ROBAXIN) 500 MG tablet  Every 8 hours PRN        06/06/22 0956              Davonna Belling, MD 06/06/22 1415

## 2022-06-06 NOTE — ED Triage Notes (Signed)
L flank pain radiating to groin x 2 weeks. Also endorses frequent urination.

## 2022-06-06 NOTE — ED Notes (Signed)
Discharge paperwork given and verbally understood. 

## 2022-09-11 IMAGING — MG MM DIGITAL SCREENING BILAT W/ TOMO AND CAD
8 series · 8 of 24 positions shown · non-contrast
Comparison: Previous exam(s).

ACR Breast Density Category a: The breast tissue is almost entirely
fatty.

CLINICAL DATA: Screening.

EXAM:
DIGITAL SCREENING BILATERAL MAMMOGRAM WITH TOMOSYNTHESIS AND CAD
TECHNIQUE: Bilateral screening digital craniocaudal and mediolateral oblique
mammograms were obtained. Bilateral screening digital breast
tomosynthesis was performed. The images were evaluated with
computer-aided detection.

[L MLO synth-2D]
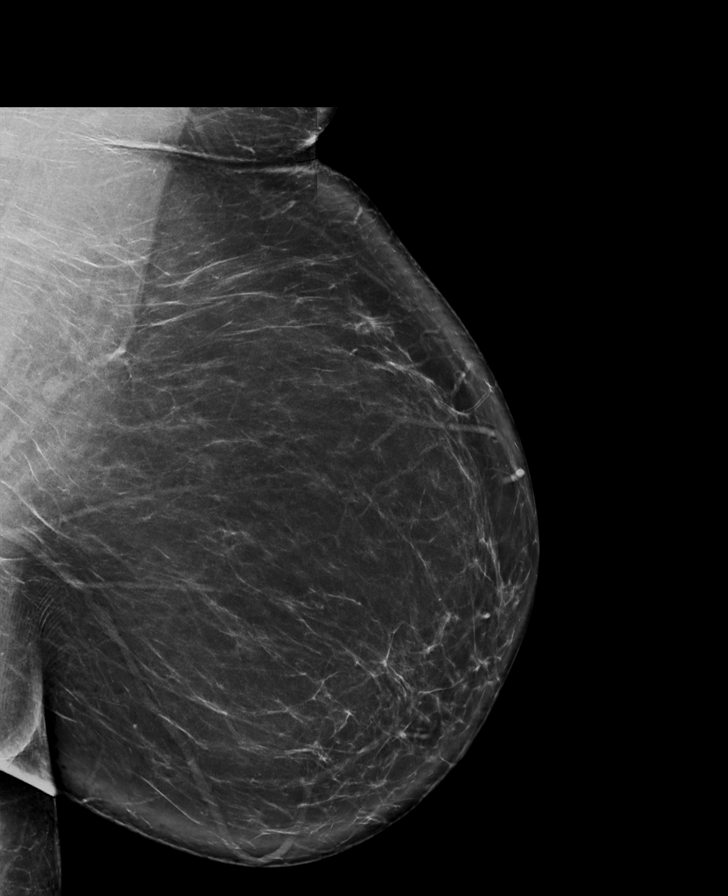

[R MLO synth-2D]
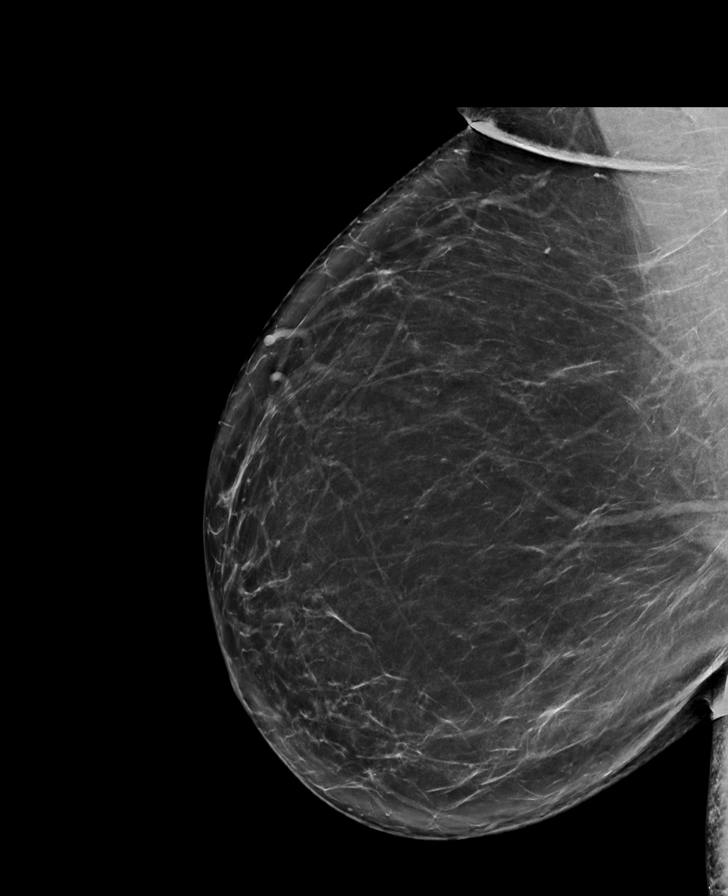

[L CC synth-2D]
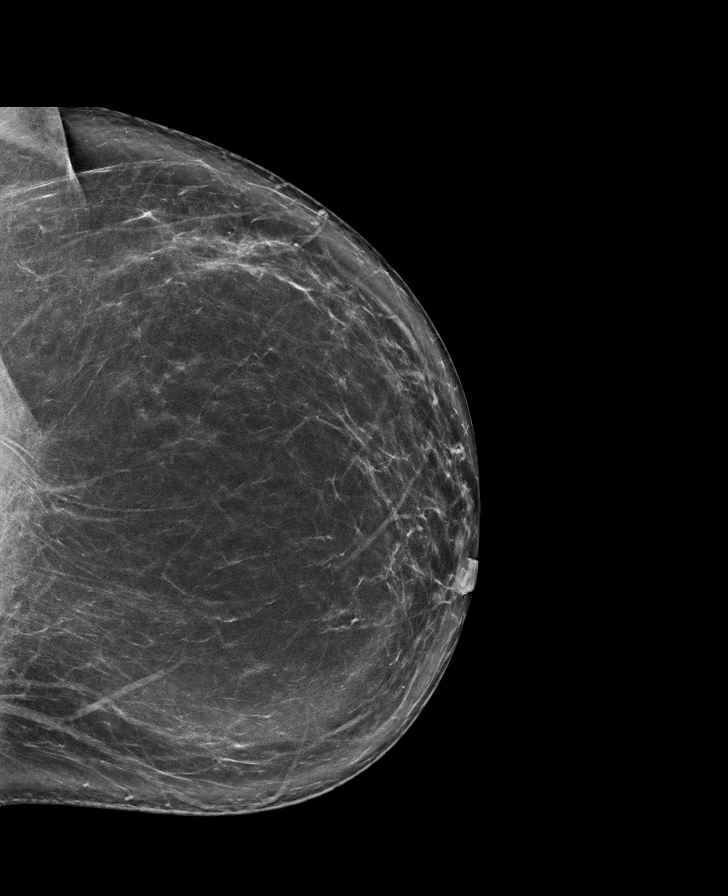

[R CC synth-2D]
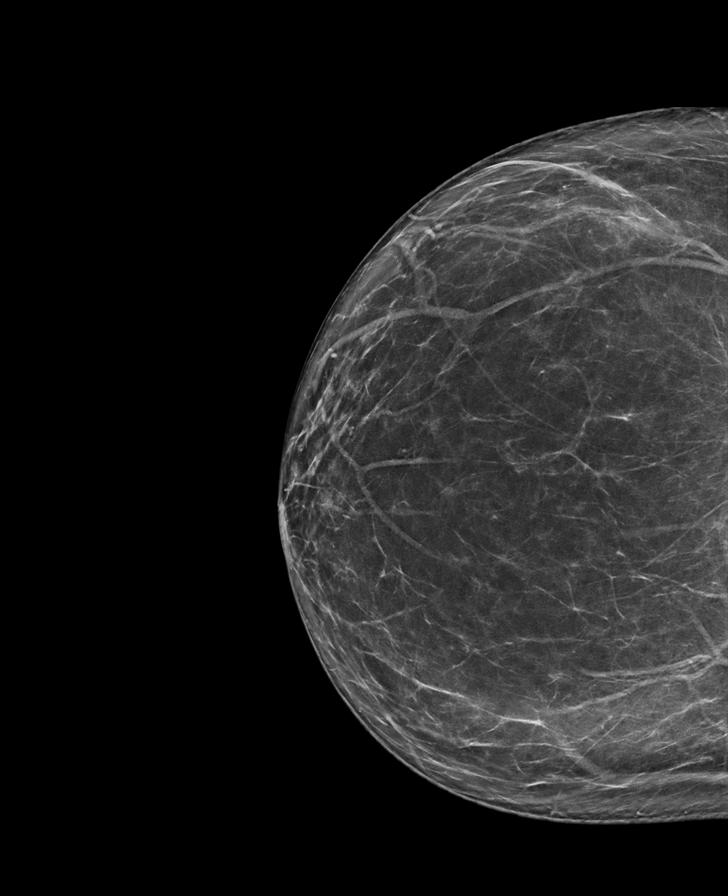

[L CC tomo · tomo slice 47/93.0]
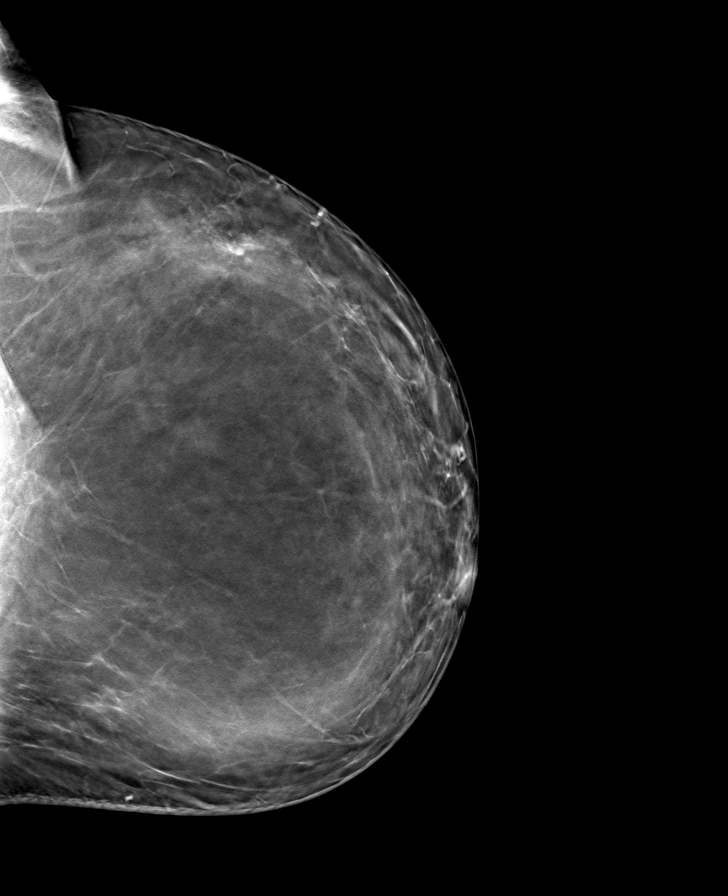

[R MLO tomo · tomo slice 47/94.0]
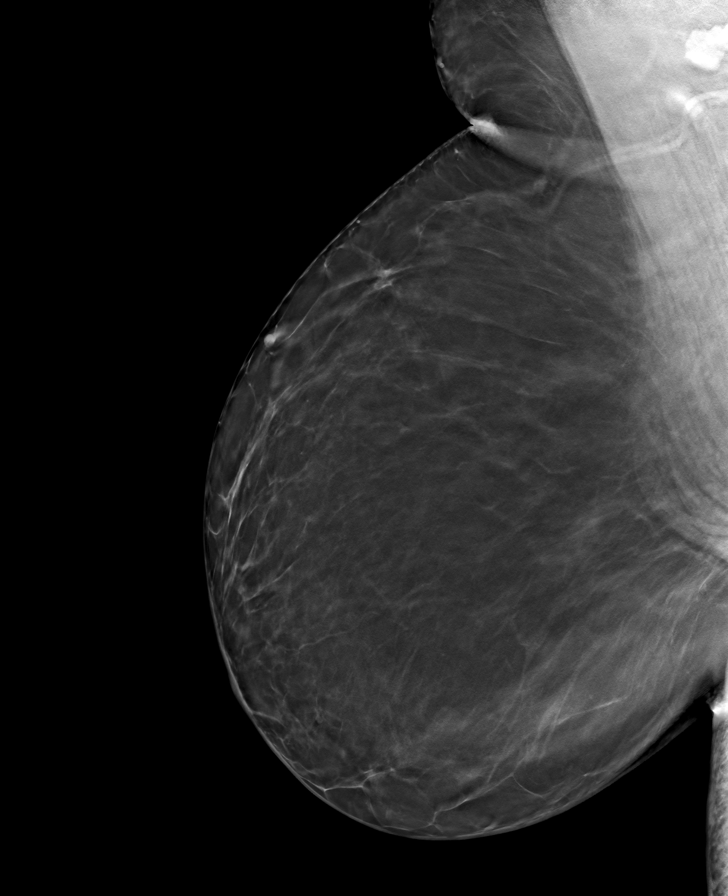

[R CC tomo · tomo slice 43/85.0]
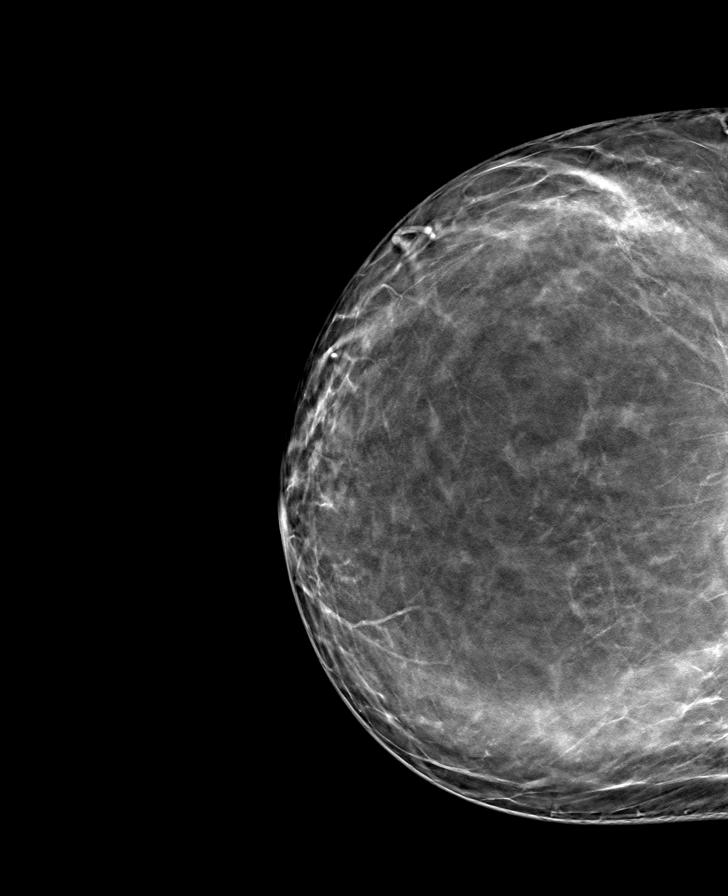

[L MLO tomo · tomo slice 51/101.0]
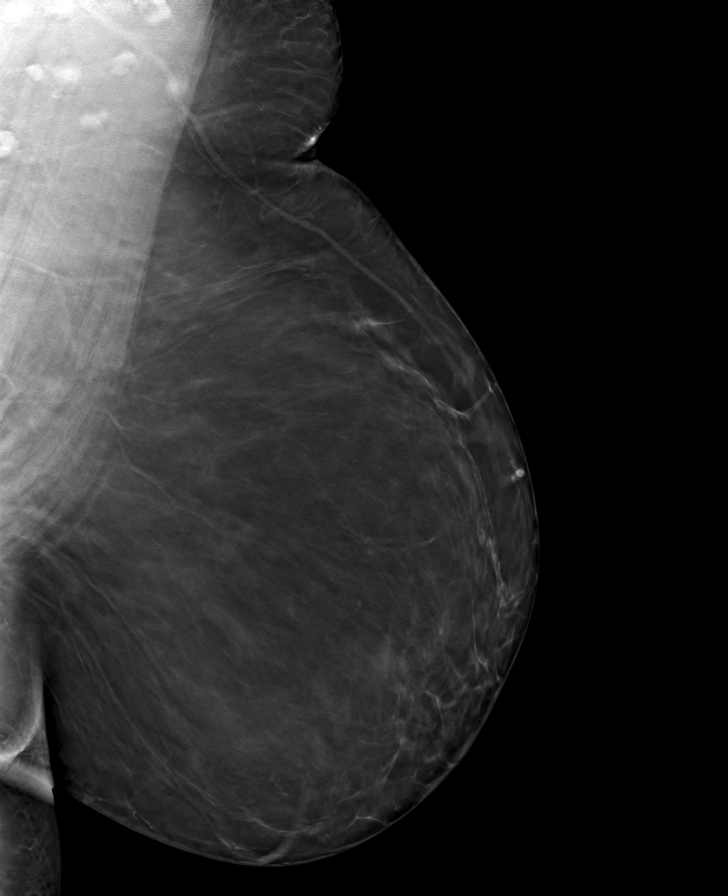

[8 of 24 positions shown; findings below may reference images not displayed]

FINDINGS: There are no findings suspicious for malignancy.
IMPRESSION: No mammographic evidence of malignancy. A result letter of this
screening mammogram will be mailed directly to the patient.

RECOMMENDATION:
Screening mammogram in one year. (Code:0E-3-N98)

BI-RADS CATEGORY  1: Negative.

## 2022-12-31 ENCOUNTER — Telehealth (INDEPENDENT_AMBULATORY_CARE_PROVIDER_SITE_OTHER): Payer: Self-pay | Admitting: Primary Care

## 2022-12-31 ENCOUNTER — Ambulatory Visit (INDEPENDENT_AMBULATORY_CARE_PROVIDER_SITE_OTHER): Payer: Self-pay | Admitting: *Deleted

## 2022-12-31 ENCOUNTER — Ambulatory Visit (INDEPENDENT_AMBULATORY_CARE_PROVIDER_SITE_OTHER): Payer: Medicaid Other | Admitting: Primary Care

## 2022-12-31 NOTE — Telephone Encounter (Signed)
Called pt to schedule appt for abdominal pain; could not reach but left a voicemail. Appts available with provider for 10/14. Recommend mobile clinic walk-in.

## 2022-12-31 NOTE — Telephone Encounter (Signed)
  Chief Complaint: abdominal pain Symptoms: moderate/severe pain in abdomen since this morning, headache, nausea Frequency: started today Pertinent Negatives: Patient denies vomiting Disposition: [] ED /[x] Urgent Care (no appt availability in office) / [] Appointment(In office/virtual)/ []  [place] [facility]/ [] Home Care/ [] Refused Recommended Disposition /[] [place] [facility]/ []  Follow-up with PCP Additional Notes: Office does have open appointment- but patient can not make the appointment- advised UC/ED due to severity of the pain- not sure if patient will go  Reason for Disposition  [1] MILD-MODERATE pain AND [2] constant AND [3] present > 2 hours  Answer Assessment - Initial Assessment Questions 1. LOCATION: "Where does it hurt?"      All over 2. RADIATION: "Does the pain shoot anywhere else?" (e.g., chest, back)     No  radiation 3. ONSET: "When did the pain begin?" (e.g., minutes, hours or days ago)      This morning 4. SUDDEN: "Gradual or sudden onset?"     Sudden 5. PATTERN "Does the pain come and go, or is it constant?"    - If it comes and goes: "How long does it last?" "Do you have pain now?"     (Note: Comes and goes means the pain is intermittent. It goes away completely between bouts.)    - If constant: "Is it getting better, staying the same, or getting worse?"      (Note: Constant means the pain never goes away completely; most serious pain is constant and gets worse.)      constant 6. SEVERITY: "How bad is the pain?"  (e.g., Scale 1-10; mild, moderate, or severe)    - MILD (1-3): Doesn't interfere with normal activities, abdomen soft and not tender to touch.     - MODERATE (4-7): Interferes with normal activities or awakens from sleep, abdomen tender to touch.     - SEVERE (8-10): Excruciating pain, doubled over, unable to do any normal activities.       Nausea, moderate/severe 7. RECURRENT SYMPTOM: "Have you ever had this type of stomach pain before?" If Yes,  ask: "When was the last time?" and "What happened that time?"      no 8. CAUSE: "What do you think is causing the stomach pain?"     Possible food poisoning  Protocols used: Abdominal Pain - Female-A-AH

## 2022-12-31 NOTE — Telephone Encounter (Signed)
Please contact and schedule an appointment or direct to mobile bus

## 2023-02-12 ENCOUNTER — Ambulatory Visit (INDEPENDENT_AMBULATORY_CARE_PROVIDER_SITE_OTHER): Payer: Managed Care, Other (non HMO) | Admitting: Primary Care

## 2023-02-20 ENCOUNTER — Ambulatory Visit (INDEPENDENT_AMBULATORY_CARE_PROVIDER_SITE_OTHER): Payer: Managed Care, Other (non HMO) | Admitting: Primary Care

## 2023-02-20 ENCOUNTER — Telehealth (INDEPENDENT_AMBULATORY_CARE_PROVIDER_SITE_OTHER): Payer: Self-pay | Admitting: Pharmacist

## 2023-02-20 NOTE — Telephone Encounter (Signed)
Called pt to see if we could schedule a virtual apt so that pt can still be seen today.

## 2023-03-13 ENCOUNTER — Encounter (INDEPENDENT_AMBULATORY_CARE_PROVIDER_SITE_OTHER): Payer: Self-pay | Admitting: Primary Care

## 2023-03-13 ENCOUNTER — Ambulatory Visit (INDEPENDENT_AMBULATORY_CARE_PROVIDER_SITE_OTHER): Payer: Commercial Managed Care - HMO | Admitting: Primary Care

## 2023-03-13 VITALS — BP 150/87 | HR 86 | Resp 16 | Ht 68.0 in | Wt 274.4 lb

## 2023-03-13 DIAGNOSIS — N926 Irregular menstruation, unspecified: Secondary | ICD-10-CM

## 2023-03-13 DIAGNOSIS — M25572 Pain in left ankle and joints of left foot: Secondary | ICD-10-CM

## 2023-03-13 DIAGNOSIS — Z0000 Encounter for general adult medical examination without abnormal findings: Secondary | ICD-10-CM

## 2023-03-13 DIAGNOSIS — Z2821 Immunization not carried out because of patient refusal: Secondary | ICD-10-CM

## 2023-03-13 DIAGNOSIS — Z114 Encounter for screening for human immunodeficiency virus [HIV]: Secondary | ICD-10-CM

## 2023-03-13 DIAGNOSIS — Z111 Encounter for screening for respiratory tuberculosis: Secondary | ICD-10-CM

## 2023-03-13 DIAGNOSIS — Z1231 Encounter for screening mammogram for malignant neoplasm of breast: Secondary | ICD-10-CM

## 2023-03-13 DIAGNOSIS — R030 Elevated blood-pressure reading, without diagnosis of hypertension: Secondary | ICD-10-CM

## 2023-03-13 DIAGNOSIS — Z1159 Encounter for screening for other viral diseases: Secondary | ICD-10-CM

## 2023-03-13 DIAGNOSIS — E7800 Pure hypercholesterolemia, unspecified: Secondary | ICD-10-CM

## 2023-03-13 MED ORDER — IBUPROFEN 800 MG PO TABS: 800.0000 mg | ORAL_TABLET | Freq: Three times a day (TID) | ORAL | 1 refills | Status: DC | PRN

## 2023-03-13 NOTE — Progress Notes (Signed)
[facility]  Laurie Hull is a 57 y.o. female presents to office today for annual physical exam examination.    Concerns today include: 1. 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 Pain worst with weather changes  Occupation: CNA, Marital status: S, Substance use: N Diet: monitoring watching she eats eats but loves potato, Exercise: yes- gym membership- water aerobics   Health Maintenance  Topic Date Due   HIV Screening  Never done   Hepatitis C Screening  Never done   DTaP/Tdap/Td (1 - Tdap) Never done   Zoster Vaccines- Shingrix (1 of 2) Never done   MAMMOGRAM  09/24/2022   INFLUENZA VACCINE  11/01/2022   COVID-19 Vaccine (3 - 2023-24 season) 12/02/2022   Cervical Cancer Screening (HPV/Pap Cotest)  07/07/2026   Colonoscopy  03/03/2028   HPV VACCINES  Aged Out     Past Medical History:  Diagnosis Date   Allergy    SEASONAL   Bell's palsy    EARLY 2000'S   Fibroids    History of anesthesia complications    HARD TO WAKE   Social History   Socioeconomic History   Marital status: Single    Spouse name: Not on file   Number of children: Not on file   Years of education: Not on file   Highest education level: Not on file  Occupational History   Not on file  Tobacco Use   Smoking status: Never   Smokeless tobacco: Never   Tobacco comments:    smoke weed   Vaping Use   Vaping status: Never Used  Substance and Sexual Activity   Alcohol use: Yes    Alcohol/week: 5.0 standard drinks of alcohol    Types: 5 Glasses of wine per week    Comment: occ   Drug use: Yes    Types: Marijuana   Sexual activity: Yes    Comment: States no possibility of pregnancy/ approx period 2 months ago/states menopoausal  Other Topics Concern   Not on file  Social History Narrative   Not on file   Social Determinants of Health   Financial  Resource Strain: Not on file  Food Insecurity: Not on file  Transportation Needs: Not on file  Physical Activity: Not on file  Stress: Not on file  Social Connections: Not on file  Intimate Partner Violence: Not on file   Past Surgical History:  Procedure Laterality Date   HERNIA REPAIR     KNEE SURGERY Left    MYOMECTOMY     WISDOM TOOTH EXTRACTION     Family History  Problem Relation Age of Onset   Diabetes Mother    Hypertension Mother    Heart disease Father    Hypertension Father    Colon polyps Neg Hx    Colon cancer Neg Hx    Esophageal cancer Neg Hx    Rectal cancer Neg Hx    Stomach cancer Neg Hx    Health Maintenance  Topic Date Due   HIV Screening  Never done   Hepatitis C Screening  Never done   DTaP/Tdap/Td (1 - Tdap) Never done   Zoster Vaccines- Shingrix (1 of 2) Never done   MAMMOGRAM  09/24/2022   INFLUENZA VACCINE  11/01/2022   COVID-19 Vaccine (3 - 2023-24 season) 12/02/2022   Cervical Cancer Screening (HPV/Pap Cotest)  07/07/2026   Colonoscopy  03/03/2028   HPV VACCINES  Aged Out     Current Outpatient Medications:    acetaminophen-codeine (TYLENOL #3) 300-30 MG tablet, Take 1 tablet by mouth every 8 (eight) hours as needed for moderate pain., Disp: 30 tablet, Rfl: 0   diclofenac (VOLTAREN) 75 MG EC tablet, Take 1 tablet (75 mg total) by mouth 2 (two) times daily. (Patient not taking: Reported on 05/30/2022), Disp: 30 tablet, Rfl: 2   HYDROcodone-acetaminophen (NORCO) 5-325 MG tablet, Take 1 tablet by mouth 2 (two) times daily as needed. (Patient not taking: Reported on 05/30/2022), Disp: 20 tablet, Rfl: 0   ibuprofen (ADVIL) 800 MG tablet, Take 1 tablet (800 mg total) by mouth every 8 (eight) hours as needed for moderate pain., Disp: 60 tablet, Rfl: 0   methocarbamol (ROBAXIN) 500 MG tablet, Take 1 tablet (500 mg total) by mouth every 8 (eight) hours as needed for muscle spasms., Disp: 8 tablet, Rfl: 0   traMADol (ULTRAM) 50 MG tablet, Take 1 tablet  (50 mg total) by mouth 3 (three) times daily as needed. (Patient not taking: Reported on 05/30/2022), Disp: 30 tablet, Rfl: 2 Outpatient Encounter Medications as of 03/13/2023  Medication Sig   acetaminophen-codeine (TYLENOL #3) 300-30 MG tablet Take 1 tablet by mouth every 8 (eight) hours as needed for moderate pain.   diclofenac (VOLTAREN) 75 MG EC tablet Take 1 tablet (75 mg total) by mouth 2 (two) times daily. (Patient not taking: Reported on 05/30/2022)   HYDROcodone-acetaminophen (NORCO) 5-325 MG tablet Take 1 tablet by mouth 2 (two) times daily as needed. (Patient not taking: Reported on 05/30/2022)   ibuprofen (ADVIL) 800 MG tablet Take 1 tablet (800 mg total) by mouth every 8 (eight) hours as needed for moderate pain.   methocarbamol (ROBAXIN) 500 MG tablet Take 1 tablet (500 mg total) by mouth every 8 (eight) hours as needed for muscle spasms.   traMADol (ULTRAM) 50 MG tablet Take 1 tablet (50 mg total) by mouth 3 (three) times daily as needed. (Patient not taking: Reported on 05/30/2022)   No facility-administered encounter medications on file as of 03/13/2023.    Allergies  Allergen Reactions   Oxycodone-Acetaminophen Itching and Rash     ROS: Review of Systems Pertinent items noted in HPI and remainder of comprehensive ROS otherwise negative.    Physical exam General: No apparent distress. Eyes: Extraocular eye movements intact, pupils equal and round. Neck: Supple, trachea midline. Thyroid: No enlargement, mobile without fixation, no tenderness. Cardiovascular: Regular rhythm and rate, no murmur, normal radial pulses. Respiratory: Normal respiratory effort, clear to auscultation. Gastrointestinal: Normal pitch active bowel sounds, nontender abdomen without distention or appreciable hepatomegaly. Musculoskeletal: Normal muscle tone, no tenderness on palpation of tibia, no excessive thoracic kyphosis. Skin: Appropriate warmth, no visible rash. Mental status: Alert, conversant,  speech clear, thought logical, appropriate mood and affect, no hallucinations or delusions evident. Hematologic/lymphatic: No cervical adenopathy, no visible ecchymoses.   Laurie Hull was seen today for annual exam.  Diagnoses and all orders for this visit:   Assessment/ Plan: Theodoro Kalata here for annual physical exam.  Morley was seen today for annual exam.  Diagnoses and all orders for this visit:  Annual physical exam  Acute left ankle pain See HPI  -     ibuprofen (ADVIL) 800 MG tablet; Take 1 tablet (800 mg total) by mouth every 8 (eight) hours as needed.  Influenza vaccination declined  Herpes zoster vaccination declined  Tetanus, diphtheria, and acellular pertussis (Tdap) vaccination declined  PPD screening test 2/2 creening-pulmonary TB -  TB Skin Test  Elevated LDL cholesterol level -     Lipid panel  Elevated blood pressure reading -     CMP14+EGFR  Irregular menstrual cycle -     CBC with Differential/Platelet  Encounter for HCV screening test for low risk patient -     HCV Ab w Reflex to Quant PCR  Encounter for screening mammogram for malignant neoplasm of breast -     MM DIGITAL SCREENING BILATERAL; Future  Encounter for screening for HIV -     HIV Antibody (routine testing w rflx)  Morbidly obese (HCC)  Started exercising program BMI > 40  Counseled on healthy lifestyle choices, including diet (rich in fruits, vegetables and lean meats and low in salt and simple carbohydrates) and exercise (at least 30 minutes of moderate physical activity daily).  Patient to follow up in 1 year for annual exam or sooner if needed.  The above assessment and management plan was discussed with the patient. The patient verbalized understanding of and has agreed to the management plan. Patient is aware to call the clinic if symptoms persist or worsen. Patient is aware when to return to the clinic for a follow-up visit. Patient educated on when it is appropriate to go  to the emergency department.   This note has been created with Education officer, environmental. Any transcriptional errors are unintentional.   Grayce Sessions, NP 03/13/2023, 1:51 PM

## 2023-03-13 NOTE — Patient Instructions (Signed)
Calorie Counting for Weight Loss Calories are units of energy. Your body needs a certain number of calories from food to keep going throughout the day. When you eat or drink more calories than your body needs, your body stores the extra calories mostly as fat. When you eat or drink fewer calories than your body needs, your body burns fat to get the energy it needs. Calorie counting means keeping track of how many calories you eat and drink each day. Calorie counting can be helpful if you need to lose weight. If you eat fewer calories than your body needs, you should lose weight. Ask your health care provider what a healthy weight is for you. For calorie counting to work, you will need to eat the right number of calories each day to lose a healthy amount of weight per week. A dietitian can help you figure out how many calories you need in a day and will suggest ways to reach your calorie goal. A healthy amount of weight to lose each week is usually 1-2 lb (0.5-0.9 kg). This usually means that your daily calorie intake should be reduced by 500-750 calories. Eating 1,200-1,500 calories a day can help most women lose weight. Eating 1,500-1,800 calories a day can help most men lose weight. What do I need to know about calorie counting? Work with your health care provider or dietitian to determine how many calories you should get each day. To meet your daily calorie goal, you will need to: Find out how many calories are in each food that you would like to eat. Try to do this before you eat. Decide how much of the food you plan to eat. Keep a food log. Do this by writing down what you ate and how many calories it had. To successfully lose weight, it is important to balance calorie counting with a healthy lifestyle that includes regular activity. Where do I find calorie information?  The number of calories in a food can be found on a Nutrition Facts label. If a food does not have a Nutrition Facts label, try  to look up the calories online or ask your dietitian for help. Remember that calories are listed per serving. If you choose to have more than one serving of a food, you will have to multiply the calories per serving by the number of servings you plan to eat. For example, the label on a package of bread might say that a serving size is 1 slice and that there are 90 calories in a serving. If you eat 1 slice, you will have eaten 90 calories. If you eat 2 slices, you will have eaten 180 calories. How do I keep a food log? After each time that you eat, record the following in your food log as soon as possible: What you ate. Be sure to include toppings, sauces, and other extras on the food. How much you ate. This can be measured in cups, ounces, or number of items. How many calories were in each food and drink. The total number of calories in the food you ate. Keep your food log near you, such as in a pocket-sized notebook or on an app or website on your mobile phone. Some programs will calculate calories for you and show you how many calories you have left to meet your daily goal. What are some portion-control tips? Know how many calories are in a serving. This will help you know how many servings you can have of a certain   food. Use a measuring cup to measure serving sizes. You could also try weighing out portions on a kitchen scale. With time, you will be able to estimate serving sizes for some foods. Take time to put servings of different foods on your favorite plates or in your favorite bowls and cups so you know what a serving looks like. Try not to eat straight from a food's packaging, such as from a bag or box. Eating straight from the package makes it hard to see how much you are eating and can lead to overeating. Put the amount you would like to eat in a cup or on a plate to make sure you are eating the right portion. Use smaller plates, glasses, and bowls for smaller portions and to prevent  overeating. Try not to multitask. For example, avoid watching TV or using your computer while eating. If it is time to eat, sit down at a table and enjoy your food. This will help you recognize when you are full. It will also help you be more mindful of what and how much you are eating. What are tips for following this plan? Reading food labels Check the calorie count compared with the serving size. The serving size may be smaller than what you are used to eating. Check the source of the calories. Try to choose foods that are high in protein, fiber, and vitamins, and low in saturated fat, trans fat, and sodium. Shopping Read nutrition labels while you shop. This will help you make healthy decisions about which foods to buy. Pay attention to nutrition labels for low-fat or fat-free foods. These foods sometimes have the same number of calories or more calories than the full-fat versions. They also often have added sugar, starch, or salt to make up for flavor that was removed with the fat. Make a grocery list of lower-calorie foods and stick to it. Cooking Try to cook your favorite foods in a healthier way. For example, try baking instead of frying. Use low-fat dairy products. Meal planning Use more fruits and vegetables. One-half of your plate should be fruits and vegetables. Include lean proteins, such as chicken, turkey, and fish. Lifestyle Each week, aim to do one of the following: 150 minutes of moderate exercise, such as walking. 75 minutes of vigorous exercise, such as running. General information Know how many calories are in the foods you eat most often. This will help you calculate calorie counts faster. Find a way of tracking calories that works for you. Get creative. Try different apps or programs if writing down calories does not work for you. What foods should I eat?  Eat nutritious foods. It is better to have a nutritious, high-calorie food, such as an avocado, than a food with  few nutrients, such as a bag of potato chips. Use your calories on foods and drinks that will fill you up and will not leave you hungry soon after eating. Examples of foods that fill you up are nuts and nut butters, vegetables, lean proteins, and high-fiber foods such as whole grains. High-fiber foods are foods with more than 5 g of fiber per serving. Pay attention to calories in drinks. Low-calorie drinks include water and unsweetened drinks. The items listed above may not be a complete list of foods and beverages you can eat. Contact a dietitian for more information. What foods should I limit? Limit foods or drinks that are not good sources of vitamins, minerals, or protein or that are high in unhealthy fats. These   include: Candy. Other sweets. Sodas, specialty coffee drinks, alcohol, and juice. The items listed above may not be a complete list of foods and beverages you should avoid. Contact a dietitian for more information. How do I count calories when eating out? Pay attention to portions. Often, portions are much larger when eating out. Try these tips to keep portions smaller: Consider sharing a meal instead of getting your own. If you get your own meal, eat only half of it. Before you start eating, ask for a container and put half of your meal into it. When available, consider ordering smaller portions from the menu instead of full portions. Pay attention to your food and drink choices. Knowing the way food is cooked and what is included with the meal can help you eat fewer calories. If calories are listed on the menu, choose the lower-calorie options. Choose dishes that include vegetables, fruits, whole grains, low-fat dairy products, and lean proteins. Choose items that are boiled, broiled, grilled, or steamed. Avoid items that are buttered, battered, fried, or served with cream sauce. Items labeled as crispy are usually fried, unless stated otherwise. Choose water, low-fat milk,  unsweetened iced tea, or other drinks without added sugar. If you want an alcoholic beverage, choose a lower-calorie option, such as a glass of wine or light beer. Ask for dressings, sauces, and syrups on the side. These are usually high in calories, so you should limit the amount you eat. If you want a salad, choose a garden salad and ask for grilled meats. Avoid extra toppings such as bacon, cheese, or fried items. Ask for the dressing on the side, or ask for olive oil and vinegar or lemon to use as dressing. Estimate how many servings of a food you are given. Knowing serving sizes will help you be aware of how much food you are eating at restaurants. Where to find more information Centers for Disease Control and Prevention: www.cdc.gov U.[place]. Department of Agriculture: myplate.gov Summary Calorie counting means keeping track of how many calories you eat and drink each day. If you eat fewer calories than your body needs, you should lose weight. A healthy amount of weight to lose per week is usually 1-2 lb (0.5-0.9 kg). This usually means reducing your daily calorie intake by 500-750 calories. The number of calories in a food can be found on a Nutrition Facts label. If a food does not have a Nutrition Facts label, try to look up the calories online or ask your dietitian for help. Use smaller plates, glasses, and bowls for smaller portions and to prevent overeating. Use your calories on foods and drinks that will fill you up and not leave you hungry shortly after a meal. This information is not intended to replace advice given to you by your health care provider. Make sure you discuss any questions you have with your health care provider. Document Revised: 04/30/2019 Document Reviewed: 04/30/2019 Elsevier Patient Education  2023 Elsevier Inc.  

## 2023-03-13 NOTE — Progress Notes (Signed)
Tuberculin skin test applied to left ventral forearm.  Patient informed to schedule appt for nurse visit in 48-72 hours to have site read.  Herbert Deaner, RMA

## 2023-03-14 LAB — CMP14+EGFR
ALT: 12 [IU]/L (ref 0–32)
AST: 16 [IU]/L (ref 0–40)
Albumin: 4.5 g/dL (ref 3.8–4.9)
Alkaline Phosphatase: 122 [IU]/L — ABNORMAL HIGH (ref 44–121)
BUN/Creatinine Ratio: 16 (ref 9–23)
BUN: 14 mg/dL (ref 6–24)
Bilirubin Total: 0.3 mg/dL (ref 0.0–1.2)
CO2: 26 mmol/L (ref 20–29)
Calcium: 9.6 mg/dL (ref 8.7–10.2)
Chloride: 104 mmol/L (ref 96–106)
Creatinine, Ser: 0.89 mg/dL (ref 0.57–1.00)
Globulin, Total: 2.8 g/dL (ref 1.5–4.5)
Glucose: 68 mg/dL — ABNORMAL LOW (ref 70–99)
Potassium: 4.5 mmol/L (ref 3.5–5.2)
Sodium: 145 mmol/L — ABNORMAL HIGH (ref 134–144)
Total Protein: 7.3 g/dL (ref 6.0–8.5)
eGFR: 76 mL/min/{1.73_m2} (ref >59)

## 2023-03-14 LAB — CBC WITH DIFFERENTIAL/PLATELET
Basophils Absolute: 0 10*3/uL (ref 0.0–0.2)
Basos: 1 %
EOS (ABSOLUTE): 0.1 10*3/uL (ref 0.0–0.4)
Eos: 2 %
Hematocrit: 37.6 % (ref 34.0–46.6)
Hemoglobin: 12.2 g/dL (ref 11.1–15.9)
Immature Grans (Abs): 0 10*3/uL (ref 0.0–0.1)
Immature Granulocytes: 0 %
Lymphocytes Absolute: 1.6 10*3/uL (ref 0.7–3.1)
Lymphs: 33 %
MCH: 30.4 pg (ref 26.6–33.0)
MCHC: 32.4 g/dL (ref 31.5–35.7)
MCV: 94 fL (ref 79–97)
Monocytes Absolute: 0.4 10*3/uL (ref 0.1–0.9)
Monocytes: 8 %
Neutrophils Absolute: 2.7 10*3/uL (ref 1.4–7.0)
Neutrophils: 56 %
Platelets: 332 10*3/uL (ref 150–450)
RBC: 4.01 x10E6/uL (ref 3.77–5.28)
RDW: 12.1 % (ref 11.7–15.4)
WBC: 4.8 10*3/uL (ref 3.4–10.8)

## 2023-03-14 LAB — LIPID PANEL
Chol/HDL Ratio: 2.4 {ratio} (ref 0.0–4.4)
Cholesterol, Total: 199 mg/dL (ref 100–199)
HDL: 82 mg/dL (ref >39)
LDL Chol Calc (NIH): 101 mg/dL — ABNORMAL HIGH (ref 0–99)
Triglycerides: 93 mg/dL (ref 0–149)
VLDL Cholesterol Cal: 16 mg/dL (ref 5–40)

## 2023-03-14 LAB — HIV ANTIBODY (ROUTINE TESTING W REFLEX): HIV Screen 4th Generation wRfx: NONREACTIVE

## 2023-03-14 LAB — HCV INTERPRETATION

## 2023-03-14 LAB — HCV AB W REFLEX TO QUANT PCR: HCV Ab: NONREACTIVE

## 2023-03-15 ENCOUNTER — Ambulatory Visit (INDEPENDENT_AMBULATORY_CARE_PROVIDER_SITE_OTHER): Payer: Commercial Managed Care - HMO

## 2023-03-15 VITALS — BP 153/87

## 2023-03-15 DIAGNOSIS — Z111 Encounter for screening for respiratory tuberculosis: Secondary | ICD-10-CM

## 2023-03-15 DIAGNOSIS — Z0130 Encounter for examination of blood pressure without abnormal findings: Secondary | ICD-10-CM

## 2023-03-15 LAB — TB SKIN TEST
Induration: 0 mm
TB Skin Test: NEGATIVE

## 2023-03-15 NOTE — Progress Notes (Signed)
   Blood Pressure Recheck Visit  Name: Laurie Hull MRN: [id number] Date of Birth: 1966-03-29  [name] presents today for Blood Pressure recheck with clinical support staff.    BP Readings from Last 3 Encounters:  03/15/23 (!) 153/87  03/13/23 (!) 150/87  06/06/22 (!) 140/80    Current Outpatient Medications  Medication Sig Dispense Refill   ibuprofen (ADVIL) 800 MG tablet Take 1 tablet (800 mg total) by mouth every 8 (eight) hours as needed. 90 tablet 1   No current facility-administered medications for this visit.    Hypertensive Medication Review: Patient is not on any medication for bp. Pt states she does have a lot on her mind at moment her son was dx with pulmonary embolism in both lungs.   Documentation of any medication adherence discrepancies: none  Provider Recommendation:  Spoke to [place] and she stated bp is evaluated due to stress and anxiety will have pt come back for a bp check in 1 month   Patient has been given provider's recommendations and does not have any questions or concerns at this time. Patient will contact the office for any future questions or concerns.

## 2023-05-01 ENCOUNTER — Ambulatory Visit (INDEPENDENT_AMBULATORY_CARE_PROVIDER_SITE_OTHER): Payer: Commercial Managed Care - HMO | Admitting: Primary Care

## 2023-05-01 ENCOUNTER — Encounter (INDEPENDENT_AMBULATORY_CARE_PROVIDER_SITE_OTHER): Payer: Self-pay | Admitting: Primary Care

## 2023-05-01 VITALS — BP 148/92 | Ht 68.0 in | Wt 275.0 lb

## 2023-05-01 DIAGNOSIS — M1712 Unilateral primary osteoarthritis, left knee: Secondary | ICD-10-CM

## 2023-05-01 DIAGNOSIS — I10 Essential (primary) hypertension: Secondary | ICD-10-CM

## 2023-05-01 DIAGNOSIS — Z1231 Encounter for screening mammogram for malignant neoplasm of breast: Secondary | ICD-10-CM

## 2023-05-01 DIAGNOSIS — Z0130 Encounter for examination of blood pressure without abnormal findings: Secondary | ICD-10-CM

## 2023-05-01 DIAGNOSIS — M545 Low back pain, unspecified: Secondary | ICD-10-CM

## 2023-05-01 MED ORDER — AMLODIPINE BESYLATE 5 MG PO TABS: 5.0000 mg | ORAL_TABLET | Freq: Every day | ORAL | 1 refills | Status: DC

## 2023-05-01 MED ORDER — HYDROCHLOROTHIAZIDE 12.5 MG PO TABS: 12.0000 mg | ORAL_TABLET | Freq: Every day | ORAL | 1 refills | Status: DC

## 2023-05-01 NOTE — Progress Notes (Signed)
 Blood Pressure Recheck Visit  Name: Laurie Hull MRN: [id number] Date of Birth: 04/25/1965  Laurie Hull presents today for Blood Pressure recheck with clinical support staff.   Appointment changed to provider because blood pressure remains elevated last visit due to stress decided to try lifestyle modifications decrease in sodium trying to exercise but due to right knee pain that has been difficult.  Reviewing all blood pressures recently she does have a diagnosis for essential hypertension and will be treated with medication today.  She also has low back pain that radiates to her  left knee previously had orthopedic intervention.  She is morbid obese weight loss would help with knee pain and hypertension overall health.  There is no height or weight on file to calculate BMI.  Close BP Readings from Last 3 Encounters:  05/01/23 (!) 148/92  03/15/23 (!) 153/87  03/13/23 (!) 150/87    Current Outpatient Medications  Medication Sig Dispense Refill   ibuprofen (ADVIL) 800 MG tablet Take 1 tablet (800 mg total) by mouth every 8 (eight) hours as needed. 90 tablet 1   No current facility-administered medications for this visit.    Hypertensive Medication Review: Patient states that they are not taking any hypertensive medication  Documentation of any medication adherence discrepancies: none  Per provider last visit if bp is still elevated at next visit provider will see them same day. Patient has been changed over to provider and we will wait to be seen    Allergies  Allergen Reactions   Oxycodone-Acetaminophen Itching and Rash    Past Medical History:  Diagnosis Date   Allergy    SEASONAL   Bell's palsy    EARLY 2000'S   Fibroids    History of anesthesia complications    HARD TO WAKE    Current Outpatient Medications on File Prior to Visit  Medication Sig Dispense Refill   ibuprofen (ADVIL) 800 MG tablet Take 1 tablet (800 mg total) by mouth every 8 (eight) hours as  needed. 90 tablet 1   No current facility-administered medications on file prior to visit.     Objective:   Vitals:   05/01/23 0934 05/01/23 0935 05/01/23 1012  BP: (!) 156/90 (!) 148/92   Weight:   275 lb (124.7 kg)  Height:   5\' 8"  (1.727 m)    Exam General appearance : Awake, alert, not in any distress. Speech Clear. Not toxic looking HEENT: Atraumatic and Normocephalic, pupils equally reactive to light and accomodation Neck: Supple, no JVD. No cervical lymphadenopathy.  Chest: Good air entry bilaterally, no added sounds  CVS: S1 S2 regular, no murmurs.  Abdomen: Bowel sounds present, Non tender and not distended with no gaurding, rigidity or rebound. Extremities: B/L Lower Ext shows no edema, both legs are warm to touch Neurology: Awake alert, and oriented X 3, Non focal Skin: No Rash   Assessment & Plan   Estalene was seen today for blood pressure check.  Diagnoses and all orders for this visit:  BP check Remain elevated was not taking medication changed to a office visit  Encounter for screening mammogram for malignant neoplasm of breast Referral for mammogram  Essential hypertension BP goal - < 130/80 Explained that having normal blood pressure is the goal and medications are helping to get to goal and maintain normal blood pressure. DIET: Limit salt intake, read nutrition labels to check salt content, limit fried and high fatty foods  Avoid using multisymptom OTC cold preparations that generally contain sudafed which  can rise BP. Consult with pharmacist on best cold relief products to use for persons with HTN EXERCISE Discussed incorporating exercise such as walking - 30 minutes most days of the week and can do in 10 minute intervals    -     amLODipine (NORVASC) 5 MG tablet; Take 1 tablet (5 mg total) by mouth daily. -     hydrochlorothiazide (HYDRODIURIL) 12.5 MG tablet; Take 1 tablet (12.5 mg total) by mouth daily.  Chronic right-sided low back pain,  unspecified whether sciatica present 2/2 Primary osteoarthritis of left knee She is also aware that her weight is a contributing factor also She is followed by Ortho and would like to change providers   Return in about 3 months (around 07/30/2023).  The patient was given clear instructions to go to ER or return to medical center if symptoms don't improve, worsen or new problems develop. The patient verbalized understanding. The patient was told to call to get lab results if they haven't heard anything in the next week.   This note has been created with Education officer, environmental. Any transcriptional errors are unintentional.   Grayce Sessions, NP 05/05/2023, 9:14 PM   Patient has been given provider's recommendations and does not have any questions or concerns at this time. Patient will contact the office for any future questions or concerns.

## 2023-05-01 NOTE — Patient Instructions (Signed)
Nonsurgical Weight Loss Treatment Options Nonsurgical weight loss has many different components. You will learn how to work with a specialized care team to incorporate diet, exercise, and lifestyle changes into your life. To view the content, go to this web address: [email or web address]  This video will expire on: 03/13/2025. If you need access to this video following this date, please reach out to the healthcare provider who assigned it to you. This information is not intended to replace advice given to you by your health care provider. Make sure you discuss any questions you have with your health care provider. Elsevier Patient Education  2024 [employer].

## 2023-05-02 ENCOUNTER — Other Ambulatory Visit (INDEPENDENT_AMBULATORY_CARE_PROVIDER_SITE_OTHER): Payer: Self-pay | Admitting: Primary Care

## 2023-05-02 DIAGNOSIS — I10 Essential (primary) hypertension: Secondary | ICD-10-CM

## 2023-05-02 NOTE — Telephone Encounter (Signed)
Copied from CRM [id number]. Topic: Clinical - Medication Refill >> May 02, 2023 11:19 AM Bobbye Morton wrote: Most Recent Primary Care Visit:  Provider: Grayce Sessions  Department: [facility]  Visit Type: NURSE VISIT  Date: 05/01/2023  Medication:  amLODipine (NORVASC) 5 MG tablet  hydrochlorothiazide (HYDRODIURIL) 12.5 MG tablet   Has the patient contacted their pharmacy? Yes (Agent: If no, request that the patient contact the pharmacy for the refill. If patient does not wish to contact the pharmacy document the reason why and proceed with request.) (Agent: If yes, when and what did the pharmacy advise?)  Is this the correct pharmacy for this prescription? No If no, delete pharmacy and type the correct one.  This is the patient's preferred pharmacy:  [facility] [phone] [facility] [address]   [address] [place], [place] [address] Phone [phone]   Has the prescription been filled recently? Yes, having problems with picking up at pharmacy wants to change to another pharmacy   Is the patient out of the medication? Yes, pt was not able to pick up prescription   Has the patient been seen for an appointment in the last year OR does the patient have an upcoming appointment? Yes  Can we respond through MyChart? Yes  Agent: Please be advised that Rx refills may take up to 3 business days. We ask that you follow-up with your pharmacy.

## 2023-05-02 NOTE — Telephone Encounter (Signed)
Completed on yesterday per chart review

## 2023-05-17 ENCOUNTER — Ambulatory Visit
Admission: RE | Admit: 2023-05-17 | Discharge: 2023-05-17 | Disposition: A | Discharge status: Home / Self Care | Payer: Medicaid Other | Source: Ambulatory Visit | Attending: Primary Care | Admitting: Primary Care

## 2023-05-17 DIAGNOSIS — Z1231 Encounter for screening mammogram for malignant neoplasm of breast: Secondary | ICD-10-CM | POA: Diagnosis not present

## 2023-05-22 ENCOUNTER — Encounter (INDEPENDENT_AMBULATORY_CARE_PROVIDER_SITE_OTHER): Payer: Self-pay

## 2023-05-22 ENCOUNTER — Ambulatory Visit (INDEPENDENT_AMBULATORY_CARE_PROVIDER_SITE_OTHER): Payer: Medicaid Other

## 2023-05-29 ENCOUNTER — Ambulatory Visit: Payer: Medicaid Other | Admitting: Orthopaedic Surgery

## 2023-08-20 ENCOUNTER — Telehealth (INDEPENDENT_AMBULATORY_CARE_PROVIDER_SITE_OTHER): Payer: Self-pay | Admitting: Primary Care

## 2023-08-20 ENCOUNTER — Ambulatory Visit (INDEPENDENT_AMBULATORY_CARE_PROVIDER_SITE_OTHER): Admitting: Primary Care

## 2023-08-20 NOTE — Telephone Encounter (Signed)
 Per E2C2 called with pt on the line. Pt was brought into the call. Pt stated that she needed to have medication refill sent to pharmacy. Pt stated that the prescription was not sent to pharm and wanted to know if she could follow up with provider about medication. Provider tated pt needed to be seen in order to get prescription.   Pt was not seen since 04/30/22 and pt did not follow up for appt. Pt stated that she was all out of prescription and wanted provider to just refill. Provider can not refill meds without seeing pt to recheck her BP. Pt was scheduled a same day appt and pt showed up 12 minutes late and was frustrated by the time of arriving. Pt was rescheduled for a different day and was informed of the late policy.

## 2023-10-02 ENCOUNTER — Ambulatory Visit (INDEPENDENT_AMBULATORY_CARE_PROVIDER_SITE_OTHER): Admitting: Primary Care

## 2023-10-08 ENCOUNTER — Ambulatory Visit (INDEPENDENT_AMBULATORY_CARE_PROVIDER_SITE_OTHER): Admitting: Primary Care

## 2023-12-30 ENCOUNTER — Telehealth (INDEPENDENT_AMBULATORY_CARE_PROVIDER_SITE_OTHER): Payer: Self-pay | Admitting: *Deleted

## 2023-12-30 NOTE — Telephone Encounter (Signed)
 Copied from CRM #[id number]. Topic: Appointments - Scheduling Inquiry for Clinic >> Dec 30, 2023  3:06 PM Larissa S wrote: Reason for CRM: Patient requesting a TB placement test, please contact for scheduling.

## 2023-12-31 ENCOUNTER — Ambulatory Visit: Attending: Primary Care

## 2023-12-31 DIAGNOSIS — Z111 Encounter for screening for respiratory tuberculosis: Secondary | ICD-10-CM | POA: Diagnosis not present

## 2023-12-31 NOTE — Progress Notes (Signed)
 PPD Placement note Harlene Blacker, 58 y.o. female is here today for placement of PPD test Reason for PPD test: work Pt taken PPD test before: yes

## 2024-01-02 ENCOUNTER — Ambulatory Visit: Attending: Primary Care

## 2024-01-02 LAB — TB SKIN TEST
Induration: 0 mm
TB Skin Test: NEGATIVE

## 2024-02-03 ENCOUNTER — Encounter: Payer: Self-pay | Admitting: Radiology

## 2024-02-11 ENCOUNTER — Ambulatory Visit (INDEPENDENT_AMBULATORY_CARE_PROVIDER_SITE_OTHER): Admitting: Primary Care

## 2024-03-09 ENCOUNTER — Ambulatory Visit (INDEPENDENT_AMBULATORY_CARE_PROVIDER_SITE_OTHER): Admitting: Primary Care

## 2024-03-10 ENCOUNTER — Encounter (INDEPENDENT_AMBULATORY_CARE_PROVIDER_SITE_OTHER): Payer: Self-pay | Admitting: Primary Care

## 2024-03-10 ENCOUNTER — Ambulatory Visit (INDEPENDENT_AMBULATORY_CARE_PROVIDER_SITE_OTHER): Admitting: Primary Care

## 2024-03-10 ENCOUNTER — Other Ambulatory Visit (HOSPITAL_COMMUNITY)
Admission: RE | Admit: 2024-03-10 | Discharge: 2024-03-10 | Disposition: A | Discharge status: Home / Self Care | Source: Ambulatory Visit | Attending: Primary Care | Admitting: Primary Care

## 2024-03-10 VITALS — BP 145/87 | HR 67 | Resp 16 | Ht 68.0 in | Wt 278.2 lb

## 2024-03-10 DIAGNOSIS — Z113 Encounter for screening for infections with a predominantly sexual mode of transmission: Secondary | ICD-10-CM | POA: Diagnosis present

## 2024-03-10 DIAGNOSIS — N898 Other specified noninflammatory disorders of vagina: Secondary | ICD-10-CM

## 2024-03-10 DIAGNOSIS — I10 Essential (primary) hypertension: Secondary | ICD-10-CM

## 2024-03-10 DIAGNOSIS — Z760 Encounter for issue of repeat prescription: Secondary | ICD-10-CM

## 2024-03-10 MED ORDER — HYDROCHLOROTHIAZIDE 12.5 MG PO TABS: 12.0000 mg | ORAL_TABLET | Freq: Every day | ORAL | 1 refills | Status: AC

## 2024-03-10 MED ORDER — AMLODIPINE BESYLATE 5 MG PO TABS: 5.0000 mg | ORAL_TABLET | Freq: Every day | ORAL | 1 refills | Status: AC

## 2024-03-10 NOTE — Progress Notes (Signed)
 [facility]  Laurie Hull, is a 58 y.o. female  RDW:[id number]  FMW:[id number]  DOB - [birth year]  Chief Complaint  Patient presents with   std screening   Hypertension    Pt never took the amlodipine  and hydrochlorothiazide   Pt states the pharmacy never dispensed them       Subjective:   Laurie Hull is a 58 y.o. female here today for an acute visit.  She was originally and for blood pressure hypertension management medication was told none available initial medication was sent May 01, 2023.  She has been monitoring her sodium intake blood pressure is slightly elevated and will address appropriately.  She also is concerned about being a in a monogamous relationship and only 1 was monogamous and requesting a screening.  This has caused a great deal of discord and stress in her relationship.    No problems updated.  Comprehensive ROS Pertinent positive and negative noted in HPI   Allergies  Allergen Reactions   Oxycodone-Acetaminophen  Itching and Rash    Past Medical History:  Diagnosis Date   Allergy    SEASONAL   Bell's palsy    EARLY 2000'S   Fibroids    History of anesthesia complications    HARD TO WAKE    No current outpatient medications on file prior to visit.   No current facility-administered medications on file prior to visit.   Health Maintenance  Topic Date Due   Hepatitis B Vaccine (1 of 3 - 19+ 3-dose series) Never done   Pneumococcal Vaccine for age over 72 (1 of 1 - PCV) Never done   Zoster (Shingles) Vaccine (1 of 2) Never done   COVID-19 Vaccine (3 - 2025-26 season) 12/02/2023   DTaP/Tdap/Td vaccine (1 - Tdap) 03/12/2024*   Flu Shot  06/30/2024*   Pap with HPV screening  07/06/2024   Breast Cancer Screening  05/16/2025   Colon Cancer Screening  03/03/2028   Hepatitis C Screening  Completed   HIV Screening  Completed   HPV Vaccine  Aged Out   Meningitis B Vaccine  Aged Out  *Topic was postponed. The date shown is not  the original due date.    Objective:   Vitals:   03/10/24 1128  BP: (!) 145/87  Pulse: 67  Resp: 16  SpO2: 98%  Weight: 278 lb 3.2 oz (126.2 kg)  Height: 5' 8 (1.727 m)   BP Readings from Last 3 Encounters:  03/10/24 (!) 145/87  05/01/23 (!) 148/92  03/15/23 (!) 153/87      Physical Exam Vitals reviewed.  Constitutional:      Appearance: Normal appearance. She is obese.     Comments: morbid  HENT:     Head: Normocephalic.     Right Ear: Tympanic membrane and external ear normal.     Left Ear: Tympanic membrane and external ear normal.     Nose: Nose normal.     Mouth/Throat:     Mouth: Mucous membranes are moist.  Eyes:     Extraocular Movements: Extraocular movements intact.     Pupils: Pupils are equal, round, and reactive to light.  Cardiovascular:     Rate and Rhythm: Normal rate and regular rhythm.  Pulmonary:     Effort: Pulmonary effort is normal.     Breath sounds: Normal breath sounds.  Abdominal:     General: Bowel sounds are normal.     Palpations: Abdomen is soft.  Musculoskeletal:        General: Normal  range of motion.     Cervical back: Normal range of motion and neck supple.  Skin:    General: Skin is warm and dry.  Neurological:     Mental Status: She is alert and oriented to person, place, and time.  Psychiatric:        Mood and Affect: Mood normal.        Behavior: Behavior normal.        Thought Content: Thought content normal.     Assessment & Plan   Laurie Hull was seen today for std screening and hypertension.  Diagnoses and all orders for this visit:  Essential hypertension BP goal - < 130/80 Explained that having normal blood pressure is the goal and medications are helping to get to goal and maintain normal blood pressure. DIET: Limit salt intake, read nutrition labels to check salt content, limit fried and high fatty foods  Avoid using multisymptom OTC cold preparations that generally contain sudafed which can rise BP. Consult  with pharmacist on best cold relief products to use for persons with HTN EXERCISE Discussed incorporating exercise such as walking - 30 minutes most days of the week and can do in 10 minute intervals    -     hydrochlorothiazide  (HYDRODIURIL ) 12.5 MG tablet; Take 1 tablet (12.5 mg total) by mouth daily. -     amLODipine  (NORVASC ) 5 MG tablet; Take 1 tablet (5 mg total) by mouth daily.  Screening for STD (sexually transmitted disease) -     Cervicovaginal ancillary only  Medication refill -     hydrochlorothiazide  (HYDRODIURIL ) 12.5 MG tablet; Take 1 tablet (12.5 mg total) by mouth daily. -     amLODipine  (NORVASC ) 5 MG tablet; Take 1 tablet (5 mg total) by mouth daily.  Vaginal discharge Cervicovaginal ancillary     Patient have been counseled extensively about nutrition and exercise. Other issues discussed during this visit include: low cholesterol diet, weight control and daily exercise, foot care, annual eye examinations at Ophthalmology, importance of adherence with medications and regular follow-up. We also discussed long term complications of uncontrolled diabetes and hypertension.   Return for re-check blood pressure.  The patient was given clear instructions to go to ER or return to medical center if symptoms don't improve, worsen or new problems develop. The patient verbalized understanding. The patient was told to call to get lab results if they haven't heard anything in the next week.   This note has been created with Education officer, environmental. Any transcriptional errors are unintentional.   Laurie SHAUNNA Bohr, NP 03/10/2024, 12:19 PM

## 2024-03-12 LAB — CERVICOVAGINAL ANCILLARY ONLY
Bacterial Vaginitis (gardnerella): POSITIVE — AB
Candida Glabrata: NEGATIVE
Candida Vaginitis: NEGATIVE
Chlamydia: NEGATIVE
Comment: NEGATIVE
Comment: NEGATIVE
Comment: NEGATIVE
Comment: NEGATIVE
Comment: NEGATIVE
Comment: NORMAL
Neisseria Gonorrhea: NEGATIVE
Trichomonas: NEGATIVE

## 2024-03-13 ENCOUNTER — Telehealth: Payer: Self-pay

## 2024-03-13 NOTE — Telephone Encounter (Signed)
 Copied from CRM #[id number]. Topic: Clinical - Lab/Test Results >> Mar 13, 2024  8:52 AM [name] wrote: Reason for CRM:  Laurie Hull is calling to get the results from her labs yesterday. I let her know that NP Celestia has not reviewed results. If medication is needed, please send to [facility] on [address]. Please call Laurie Hull at [phone] to discuss results.

## 2024-03-13 NOTE — Telephone Encounter (Signed)
 Noted

## 2024-03-16 ENCOUNTER — Ambulatory Visit (INDEPENDENT_AMBULATORY_CARE_PROVIDER_SITE_OTHER): Payer: Self-pay | Admitting: Primary Care

## 2024-03-16 DIAGNOSIS — B9689 Other specified bacterial agents as the cause of diseases classified elsewhere: Secondary | ICD-10-CM

## 2024-03-16 MED ORDER — METRONIDAZOLE 500 MG PO TABS: 500.0000 mg | ORAL_TABLET | Freq: Two times a day (BID) | ORAL | 0 refills | Status: AC

## 2024-04-16 ENCOUNTER — Ambulatory Visit (INDEPENDENT_AMBULATORY_CARE_PROVIDER_SITE_OTHER)

## 2024-04-16 DIAGNOSIS — Z111 Encounter for screening for respiratory tuberculosis: Secondary | ICD-10-CM

## 2024-04-20 ENCOUNTER — Ambulatory Visit (INDEPENDENT_AMBULATORY_CARE_PROVIDER_SITE_OTHER): Payer: Self-pay | Admitting: Primary Care

## 2024-04-20 LAB — QUANTIFERON-TB GOLD PLUS
QuantiFERON Mitogen Value: >10 [IU]/mL
QuantiFERON Nil Value: 0.04 [IU]/mL
QuantiFERON TB1 Ag Value: 0.02 [IU]/mL
QuantiFERON TB2 Ag Value: 0.03 [IU]/mL
QuantiFERON-TB Gold Plus: NEGATIVE

## 2024-04-22 NOTE — Telephone Encounter (Signed)
foward

## 2024-04-23 ENCOUNTER — Encounter (INDEPENDENT_AMBULATORY_CARE_PROVIDER_SITE_OTHER): Payer: Self-pay

## 2024-05-04 ENCOUNTER — Telehealth: Payer: Self-pay

## 2024-05-04 NOTE — Telephone Encounter (Signed)
 Contacted pt to confirm appt for 05-05-24 pt didn't answer lvm   Sent Mychart message

## 2024-05-05 ENCOUNTER — Ambulatory Visit: Payer: Self-pay | Admitting: Primary Care
